# Patient Record
Sex: Female | Born: 1970 | Hispanic: No | Marital: Married | Smoking: Never smoker
Health system: Southern US, Community
[De-identification: ages and names within clinical notes are randomized; demographics above are authoritative.]

## PROBLEM LIST (undated history)

## (undated) DIAGNOSIS — I10 Essential (primary) hypertension: Secondary | ICD-10-CM

## (undated) DIAGNOSIS — K5790 Diverticulosis of intestine, part unspecified, without perforation or abscess without bleeding: Secondary | ICD-10-CM

## (undated) DIAGNOSIS — K219 Gastro-esophageal reflux disease without esophagitis: Secondary | ICD-10-CM

## (undated) DIAGNOSIS — J4540 Moderate persistent asthma, uncomplicated: Secondary | ICD-10-CM

## (undated) HISTORY — DX: Diverticulosis of intestine, part unspecified, without perforation or abscess without bleeding: K57.90

## (undated) HISTORY — DX: Gastro-esophageal reflux disease without esophagitis: K21.9

## (undated) HISTORY — PX: APPENDECTOMY: SHX54

## (undated) HISTORY — PX: HERNIA REPAIR: SHX51

---
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
## 2001-11-07 ENCOUNTER — Other Ambulatory Visit: Admission: RE | Admit: 2001-11-07 | Discharge: 2001-11-07 | Payer: Self-pay | Admitting: Gynecology

## 2002-11-29 ENCOUNTER — Other Ambulatory Visit: Admission: RE | Admit: 2002-11-29 | Discharge: 2002-11-29 | Payer: Self-pay | Admitting: Gynecology

## 2003-05-15 ENCOUNTER — Other Ambulatory Visit: Admission: RE | Admit: 2003-05-15 | Discharge: 2003-05-15 | Payer: Self-pay | Admitting: Gynecology

## 2003-10-10 ENCOUNTER — Other Ambulatory Visit: Admission: RE | Admit: 2003-10-10 | Discharge: 2003-10-10 | Payer: Self-pay | Admitting: Obstetrics and Gynecology

## 2004-04-29 ENCOUNTER — Inpatient Hospital Stay (HOSPITAL_COMMUNITY): Admission: AD | Admit: 2004-04-29 | Discharge: 2004-04-29 | Payer: Self-pay | Admitting: Obstetrics and Gynecology

## 2004-04-30 ENCOUNTER — Inpatient Hospital Stay (HOSPITAL_COMMUNITY): Admission: AD | Admit: 2004-04-30 | Discharge: 2004-05-03 | Payer: Self-pay | Admitting: Obstetrics and Gynecology

## 2004-06-09 ENCOUNTER — Other Ambulatory Visit: Admission: RE | Admit: 2004-06-09 | Discharge: 2004-06-09 | Payer: Self-pay | Admitting: Obstetrics and Gynecology

## 2004-12-22 ENCOUNTER — Other Ambulatory Visit: Admission: RE | Admit: 2004-12-22 | Discharge: 2004-12-22 | Payer: Self-pay | Admitting: Gynecology

## 2005-10-06 ENCOUNTER — Other Ambulatory Visit: Admission: RE | Admit: 2005-10-06 | Discharge: 2005-10-06 | Payer: Self-pay | Admitting: Gynecology

## 2006-03-29 ENCOUNTER — Other Ambulatory Visit: Admission: RE | Admit: 2006-03-29 | Discharge: 2006-03-29 | Payer: Self-pay | Admitting: Gynecology

## 2006-10-13 ENCOUNTER — Other Ambulatory Visit: Admission: RE | Admit: 2006-10-13 | Discharge: 2006-10-13 | Payer: Self-pay | Admitting: Gynecology

## 2007-03-28 ENCOUNTER — Other Ambulatory Visit: Admission: RE | Admit: 2007-03-28 | Discharge: 2007-03-28 | Payer: Self-pay | Admitting: Gynecology

## 2007-10-03 ENCOUNTER — Other Ambulatory Visit: Admission: RE | Admit: 2007-10-03 | Discharge: 2007-10-03 | Payer: Self-pay | Admitting: Gynecology

## 2008-05-15 ENCOUNTER — Other Ambulatory Visit: Admission: RE | Admit: 2008-05-15 | Discharge: 2008-05-15 | Payer: Self-pay | Admitting: Gynecology

## 2008-08-13 ENCOUNTER — Ambulatory Visit (HOSPITAL_COMMUNITY): Admission: RE | Admit: 2008-08-13 | Discharge: 2008-08-13 | Payer: Self-pay | Admitting: Family Medicine

## 2008-08-14 ENCOUNTER — Encounter (INDEPENDENT_AMBULATORY_CARE_PROVIDER_SITE_OTHER): Payer: Self-pay | Admitting: General Surgery

## 2008-08-14 ENCOUNTER — Ambulatory Visit (HOSPITAL_COMMUNITY): Admission: EM | Admit: 2008-08-14 | Discharge: 2008-08-14 | Payer: Self-pay | Admitting: Emergency Medicine

## 2009-10-07 ENCOUNTER — Ambulatory Visit: Payer: Self-pay | Admitting: Family Medicine

## 2010-02-12 IMAGING — CT CT PELVIS W/ CM
2 of 5 series · 16 of 46 positions shown, 18 images · IV contrast (omnipaque)
Comparison: None available.

CT ABDOMEN

CLINICAL DATA: Right lower quadrant pain.

CT ABDOMEN AND PELVIS WITH CONTRAST
TECHNIQUE: Multidetector CT imaging of the abdomen and pelvis was
performed using the standard protocol following bolus
administration of intravenous contrast.
Contrast: 100 ml Omnipaque 300.

[Series 2: abd_pel 5.0 b40s · axial · 0.72mm/px · z∈[-369,+21]mm · 13 of 88 slices shown, 15 images]
[im 5/88  soft-tissue]
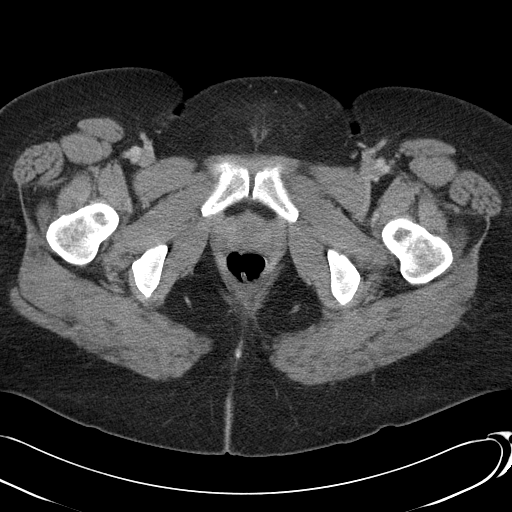
[im 5/88  bone]
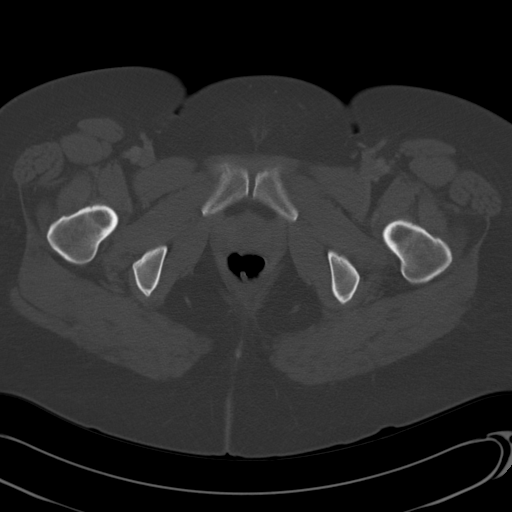
[im 14/88  soft-tissue]
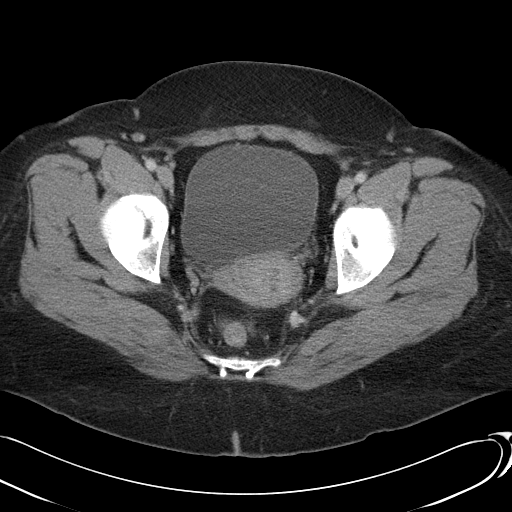
[im 19/88  soft-tissue]
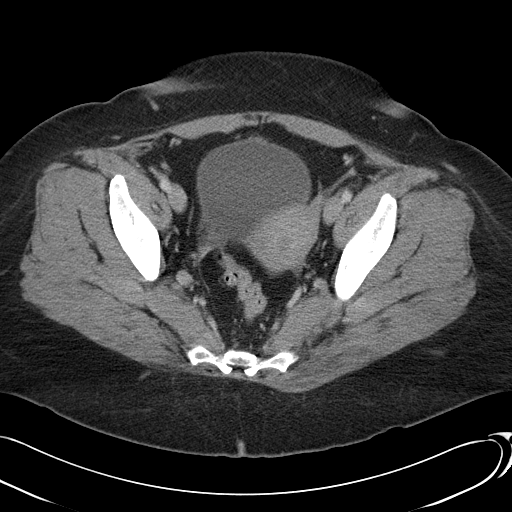
[im 23/88  soft-tissue]
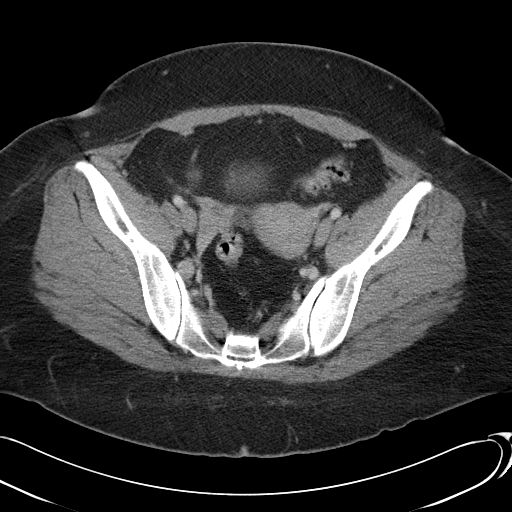
[im 33/88  soft-tissue]
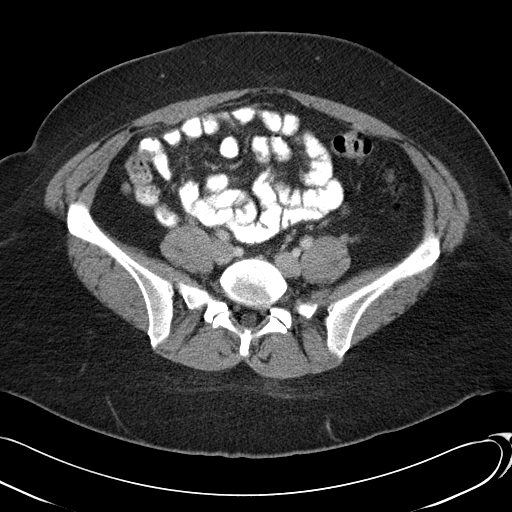
[im 37/88  soft-tissue]
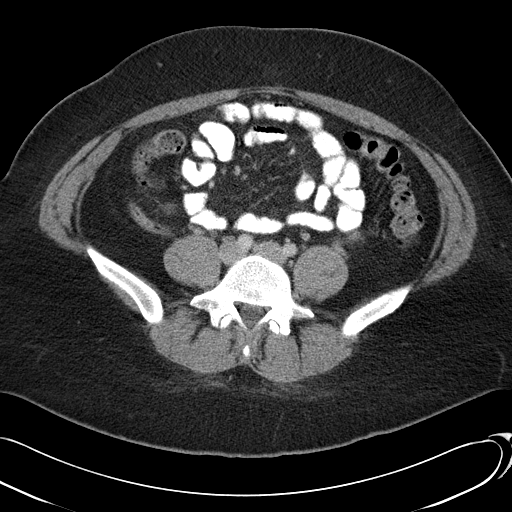
[im 46/88  soft-tissue]
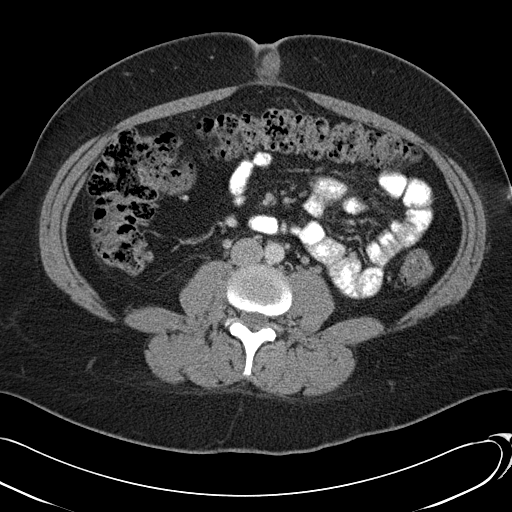
[im 51/88  soft-tissue]
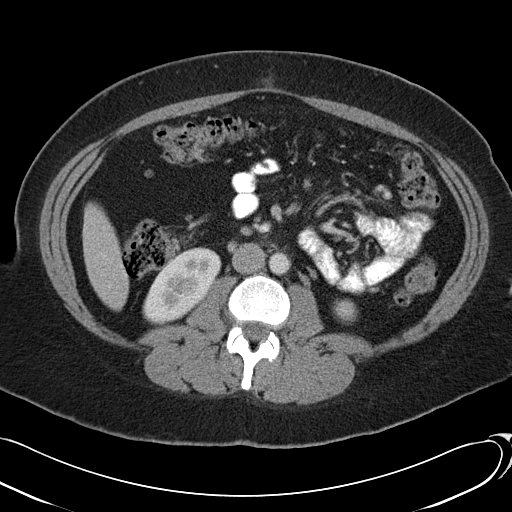
[im 55/88  soft-tissue]
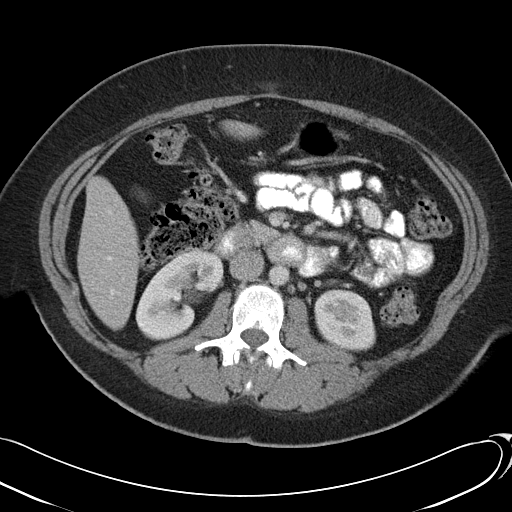
[im 55/88  bone]
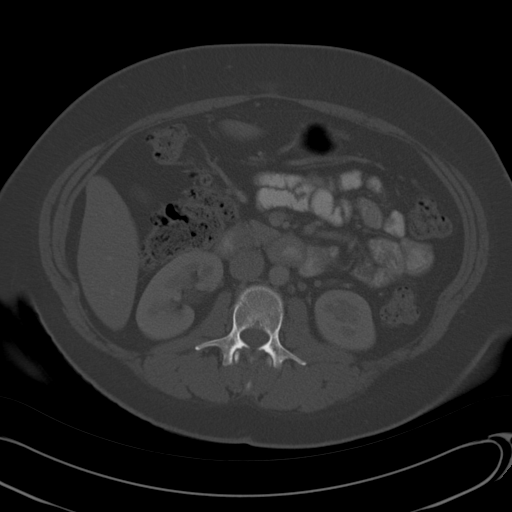
[im 65/88  soft-tissue]
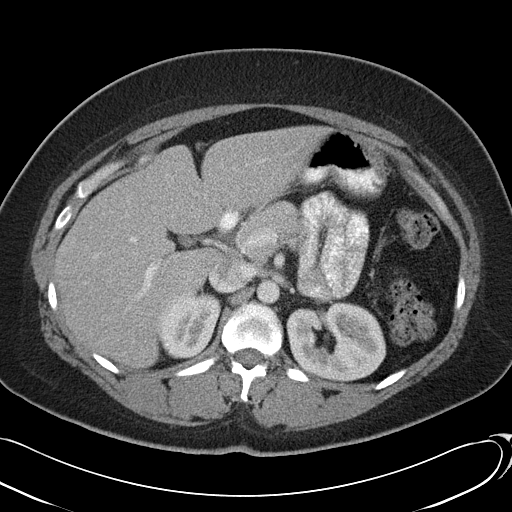
[im 69/88  soft-tissue]
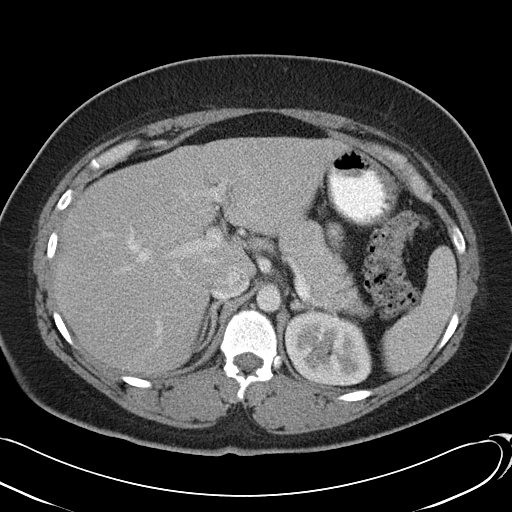
[im 74/88  soft-tissue]
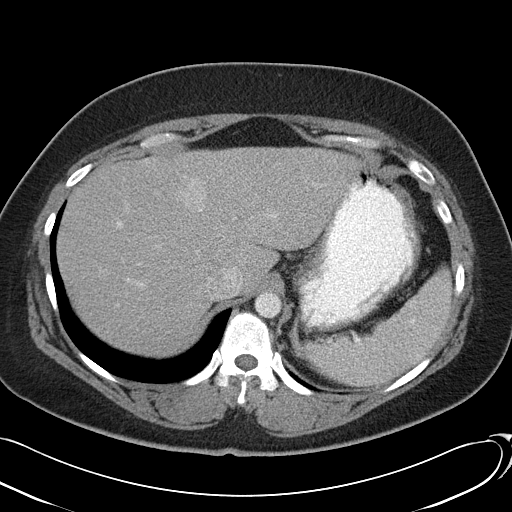
[im 83/88  soft-tissue]
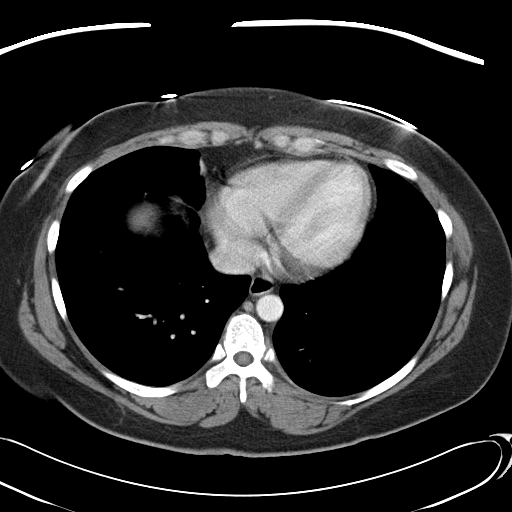

[Series 602: <mpr thick range> · coronal · 0.86mm/px · 3 of 88 slices shown]
[im 30/88  soft-tissue]
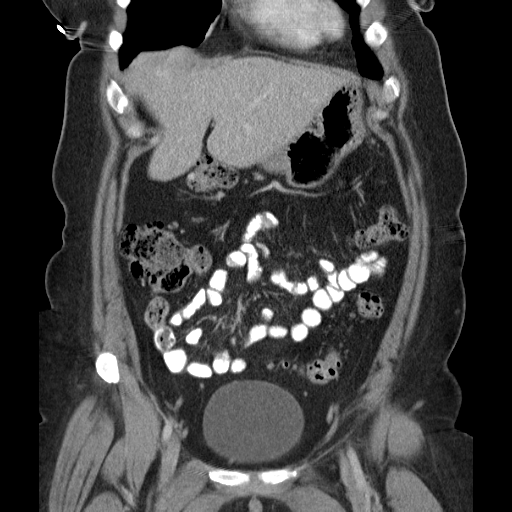
[im 39/88  soft-tissue]
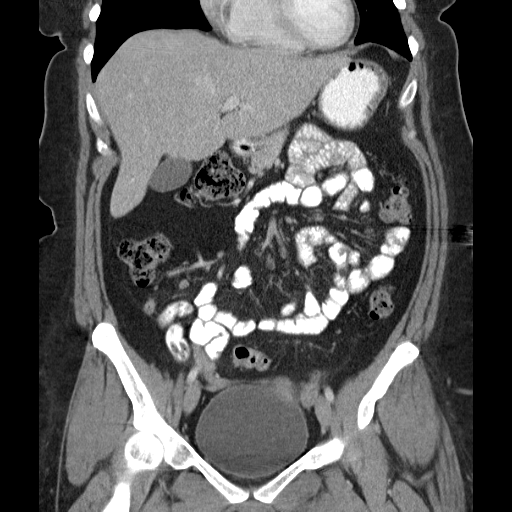
[im 49/88  soft-tissue]
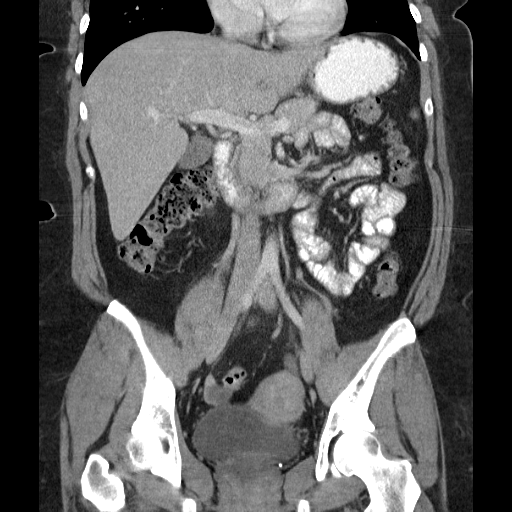

[16 of 46 positions shown; findings below may reference images not displayed]

FINDINGS: The lung bases are clear without focal nodule, mass, or
airspace disease.  Heart size is normal.  There is no significant
pleural or pericardial effusion.  The liver, the liver and spleen
are normal.  The stomach, pancreas, common bile duct and
gallbladder are normal.  The adrenal glands and kidneys are within
normal limits bilaterally.  There is no significant abdominal
lymphadenopathy or free fluid.  A periumbilical nodule measures
x 1.2 x 1.4 cm.  There is a small periumbilical hernia.  This
nodular lesion appears to be associated with stalk extending from
the fat in the hernia but external to the fascia.  It is within the
subcutaneous fat.  The bowel is unremarkable.  Bone windows are
within normal limits.
IMPRESSION: 1.  2.2 cm nodular density adjacent to a periumbilical hernia.
This may represent a herniated epiploic appendagitis.  Focal
infection is also considered.  Alternately, this could represent a
small lymph node.  If the patient undergoes surgery for her
appendix, surgical excision may be of use to evaluate this lesion
as well.

CT PELVIS
FINDINGS: Mild diverticular changes are present within the sigmoid
colon without focal inflammation to suggest diverticulitis.  The
appendix is mildly enlarged measuring 8 mm with some inflammatory
changes associated suggesting early appendicitis.  There is no free
air or free fluid.  The uterus and adnexa are within normal limits
for age.  The urinary bladder is unremarkable.  Bone windows are
normal.
IMPRESSION: 1.  Mild enlargement and straining of the appendix compatible with
early appendicitis.
2.  Otherwise unremarkable CT of the pelvis.

Critical test results telephoned to Dr. Papps at the time of

## 2010-11-23 LAB — BASIC METABOLIC PANEL
BUN: 9 mg/dL (ref 6–23)
Calcium: 9 mg/dL (ref 8.4–10.5)
Creatinine, Ser: 0.57 mg/dL (ref 0.4–1.2)
GFR calc non Af Amer: >60 mL/min (ref >60)
Potassium: 3.7 mEq/L (ref 3.5–5.1)

## 2010-11-23 LAB — DIFFERENTIAL
Basophils Absolute: 0.1 10*3/uL (ref 0.0–0.1)
Lymphocytes Relative: 27 % (ref 12–46)
Neutro Abs: 8.7 10*3/uL — ABNORMAL HIGH (ref 1.7–7.7)
Neutrophils Relative %: 64 % (ref 43–77)

## 2010-11-23 LAB — POCT PREGNANCY, URINE: Preg Test, Ur: NEGATIVE

## 2010-11-23 LAB — CBC
Hemoglobin: 14.6 g/dL (ref 12.0–15.0)
Platelets: 175 10*3/uL (ref 150–400)
RDW: 11.8 % (ref 11.5–15.5)

## 2010-12-12 ENCOUNTER — Encounter: Payer: Self-pay | Admitting: Family Medicine

## 2010-12-22 NOTE — Op Note (Signed)
NAME:  Stacy Tran, Stacy Tran                ACCOUNT NO.:  [id number]   MEDICAL RECORD NO.:  [id number]          PATIENT TYPE:  INP   LOCATION:  1524                         FACILITY:  [facility]   PHYSICIAN:  Adolph Pollack, M.D.DATE OF BIRTH:  1970-08-31   DATE OF PROCEDURE:  08/14/2008  DATE OF DISCHARGE:                               OPERATIVE REPORT   PREOPERATIVE DIAGNOSES:  Early acute appendicitis, possible umbilical  hernia.   POSTOPERATIVE DIAGNOSES:  1. Early acute appendicitis.  2. Omental nodule.  3. Epigastric hernia.   PROCEDURE:  1. Laparoscopic appendectomy.  2. Excision of omental nodule.  3. Primary epigastric hernia repair.   SURGEON:  Adolph Pollack, M.D.   ANESTHESIA:  General.   INDICATIONS:  This 40 year old female had the onset of some right lower  quadrant pain she states on January 2 that persisted.  She was feeling  around her abdomen and felt a tender nodule superior to her umbilicus.  She presented to Urgent Care and was sent to [facility] for a CT scan  which demonstrated findings consistent with early acute appendicitis as  well as a possible umbilical hernia containing fat.  She is now brought  to the operating room because of the early acute appendicitis.  At  examination she does have a tender nodule superior to the umbilicus.   TECHNIQUE:  She was brought to the operating room and placed supine on  the operating table.  General anesthetic was administered.  Foley  catheter placed in the bladder.  The abdominal wall was sterilely  prepped and draped.  Marcaine solution was infiltrated in the  supraumbilical region.  A transverse supraumbilical incision was made  through skin and subcutaneous tissue.  I noted a nodular density  superior to the umbilicus.  Using careful blunt dissection, I noted this  nodular density was attached the omentum which was coming through a  small epigastric defect consistent with a small epigastric hernia.  I  went  ahead and excised this nodule and sent it to pathology and  permanent section.  I then reduced the omentum back through the hernia  defect.  A pursestring suture was placed around the fascial defect and  the [facility] trocar was introduced and pneumoperitoneum created.   Laparoscope was then introduced and a 5 mm trocar was placed in the left  lower quadrant, one in the right upper quadrant.  The appendix was  identified and the mesoappendix divided down to the base of cecum.  The  appendix appeared mildly injected distally.  I then amputated the  appendix off the cecum along with a small cuff of cecum using the Endo-  GIA stapler.  The appendix was placed in an Endopouch bag and removed  through the Hasson trocar port incision.   The Hasson trocar was replaced and the staple line was inspected and  there was some bleeding from the medial aspect the staple line which was  controlled with hemoclips.  I then copiously irrigated out the abdominal  cavity and I examined both ovaries both of which appeared to have some  small  cysts on them.  There is one questionable area noted on the right  ovary but I could not see an obvious solid mass.   Following this, irrigation fluid was evacuated as much as possible.  I  subsequently removed all trocars and released pneumoperitoneum.  I then  closed the umbilical hernia defect in the epigastric region superior to  the umbilicus with interrupted #1 Novofil sutures.  The pursestring  suture was also tied down.  The skin incisions were closed with 4-0  Monocryl subcuticular stitches followed by Steri-Strips and sterile  dressings.   She tolerated the procedure without apparent complications and was taken  to recovery in satisfactory condition.      Adolph Pollack, M.D.  Electronically Signed     TJR/MEDQ  D:  08/14/2008  T:  08/14/2008  Job:  [id number]   cc:   Clydie Braun L. Hal Hope, M.D.  Fax: [phone]

## 2010-12-22 NOTE — H&P (Signed)
NAME:  Coad, LING                ACCOUNT NO.:  [id number]   MEDICAL RECORD NO.:  [id number]          PATIENT TYPE:  INP   LOCATION:  1524                         FACILITY:  [facility]   PHYSICIAN:  Adolph Pollack, M.D.DATE OF BIRTH:  03-Dec-1970   DATE OF ADMISSION:  08/14/2008  DATE OF DISCHARGE:                              HISTORY & PHYSICAL   CHIEF COMPLAINT:  Right lower quadrant pain and umbilical pain.   HISTORY:  This 40 year old female had the onset of some right lower  quadrant pain along with a painful umbilical nodule.  This occurred  August 10, 2008, and persisted.  She went to urgent care the evening of  August 13, 2008, and was seen, evaluated and sent for CT scan at [facility] which was consistent with early acute appendicitis and had a  questionable small umbilical hernia with fatty tissue in it.  She  subsequently sent to the emergency department and I was asked to see  her.  No fever, chills.  No nausea, vomiting, diarrhea, dysuria,  hematuria.   PAST MEDICAL HISTORY:  No chronic illnesses.  Operations, cesarean  section complicated by a wound healing problem.   DRUG ALLERGIES:  TETRACYCLINE.   CURRENT MEDICATIONS:  None.   SOCIAL HISTORY:  She is married with one child.  She works in Airline [employer].  No  tobacco or alcohol use.  Here with her husband.   FAMILY HISTORY:  Notable for hypertension in her father.  Cancer in her  mother, type unknown but her mother apparently died from it.   REVIEW OF SYSTEMS:  CARDIOVASCULAR:  She has been told she has a heart  murmur but no coronary artery disease or hypertension.  PULMONARY:  No  asthma, pneumonia, tuberculosis.  GI:  Has some intermittent  gastroesophageal reflux.  No peptic ulcer disease.  No hepatitis or  diverticulitis although on CT scan there were changes of diverticulosis  and this has been explained to her.  GU:  No kidney stones.  ENDOCRINE:  No diabetes, thyroid disease or hypercholesterolemia.   NEUROLOGIC:  No  seizures.  HEMATOLOGIC:  No bleeding disorder, blood clots or  transfusions.   PHYSICAL EXAMINATION:  GENERAL:  Generally an overweight female in no  acute distress, very pleasant and cooperative.  VITAL SIGNS:  Temperature is 98 degrees, blood pressure is 129/85, pulse  86 and respiratory rate 20.  HEENT:  Extraocular motions intact.  No icterus.  NECK:  Supple without masses.  RESPIRATORY:  Breath sounds equal and clear, respirations unlabored.  CARDIOVASCULAR:  Heart demonstrates a regular rate and regular rhythm.  No murmur.  No JVD.  No lower extremity edema.  ABDOMEN:  Soft with tender supraumbilical nodule.  There is right lower  quadrant tenderness to palpation as well.  No guarding or mass.  EXTREMITIES:  Good muscle tone and range of motion.  SKIN:  No jaundice.   LABORATORY DATA:  Is pending at this time - it was done at the urgent  care but we do not have a copy of this.  Per the patient, she also had a  chest x-ray and abdominal x-ray and were told these were all within  normal limits.   IMPRESSION:  Early acute appendicitis with a possible umbilical hernia.   PLAN:  Laparoscopic/open appendectomy.  If possible we may be able to do  a primary umbilical hernia if indeed it exists.  I discussed the  procedure and the risks with her.  Risks including but not limited to  bleeding, infection, wound healing problems, anesthesia, accidental  injury to intra-abdominal organs.  We also talked about the importance  of aftercare and light activities.  She seems to understand all this and  agrees with the plan.      Adolph Pollack, M.D.  Electronically Signed     TJR/MEDQ  D:  08/14/2008  T:  08/14/2008  Job:  [id number]   cc:   Clydie Braun L. Hal Hope, M.D.  Fax: [phone]

## 2010-12-25 NOTE — Op Note (Signed)
NAME:  Stacy Tran, Stacy Tran                ACCOUNT NO.:  [id number]   MEDICAL RECORD NO.:  [id number]          PATIENT TYPE:  INP   LOCATION:  9111                          FACILITY:  [facility]   PHYSICIAN:  Michelle L. Grewal, M.D.DATE OF BIRTH:  [birth year]   DATE OF PROCEDURE:  04/30/2004  DATE OF DISCHARGE:                                 OPERATIVE REPORT   PREOPERATIVE DIAGNOSES:  1.  Intrauterine pregnancy at term.  2.  Arrest of descent.   POSTOPERATIVE DIAGNOSES:  1.  Intrauterine pregnancy at term.  2.  Arrest of descent.   PROCEDURE:  Primary low transverse cesarean section.   SURGEON:  Michelle L. Vincente Poli, M.D.   ANESTHESIA:  Epidural.   ESTIMATED BLOOD LOSS:  900 mL.   URINE OUTPUT:  Clear.   IV FLUIDS:  800 mL.   FINDINGS:  Female infant in cephalic presentation.  Apgar's 9 at 1 minute and  9 at 5 minutes with a weight of 7 pounds 14 ounces.   DESCRIPTION OF PROCEDURE:  The patient was taken to the operating room, her  epidural found to be adequate.  She was then prepped and draped in the usual  sterile fashion. A Foley catheter had already been placed in labor and  delivery. A low transverse incision was made, carried down to the fascia and  the fascia scored in the midline and extended laterally. Rectus muscles were  separated in the midline, the perineum was entered bluntly and the  peritoneum was then entered sharply. The bladder blade was inserted, the  lower uterine segment was identified and the bladder flap was created  sharply and then digitally. The bladder blade was then readjusted. A low  transverse incision was made in the uterus, the amniotic fluid was noted to  be clear. The baby was in cephalic presentation and was delivered quite  easily. It was a female infant with Apgar's 9 at 1 minute and 9 at 5 minutes  and was 7 pounds and 14 ounces. The baby was then handed to the waiting  pediatrician after the cord was clamped and cut. The placenta was manually  removed and noted to be normal and intact. The uterus was cleared of all  clots and debris. The uterine incision was closed using #0 chromic in a  continuous running locked stitch. There was some oozing on the left corner  which was hemostatic after a figure-of-eight of #0 chromic was placed.  Irrigation was performed, hemostasis was again noted, the peritoneum was  closed using #0 Vicryl continuous running stitch and rectus muscles were  reapproximated using the same #0 Vicryl.  The fascia was closed using #0  Vicryl in continuous running stitch  starting in each corner and meeting in the midline. After irrigation in the  subcutaneous layer, the skin was closed with staples. All sponge, lap and  instruments are correct x2.  The patient tolerated the procedure well and  went to the recovery room in stable condition.      MLG/MEDQ  D:  04/30/2004  T:  05/01/2004  Job:  [id number]

## 2010-12-25 NOTE — Discharge Summary (Signed)
NAME:  Stacy Tran, Stacy Tran                ACCOUNT NO.:  [id number]   MEDICAL RECORD NO.:  [id number]          PATIENT TYPE:  INP   LOCATION:  9111                          FACILITY:  [facility]   PHYSICIAN:  Guy Sandifer. Henderson Cloud, M.D. DATE OF BIRTH:  [birth year]   DATE OF ADMISSION:  04/30/2004  DATE OF DISCHARGE:  05/03/2004                                 DISCHARGE SUMMARY   ADMITTING DIAGNOSES:  1.  Intrauterine pregnancy at term.  2.  Active labor.   DISCHARGE DIAGNOSES:  1.  Status post low transverse cesarean section secondary to arrest of      descent.  2.  Viable female infant.   PROCEDURE:  Primary low transverse cesarean section.   REASON FOR ADMISSION:  Please see written H&P.   HOSPITAL COURSE:  The patient was a 40 year old prima gravida who presented  to [facility] in active labor.  On admission, vital signs were stable.  Fetal heart tones were reactive.  Contractions were noted to be every two to  four minutes.  The cervix was dilated to 8 cm, 90% effaced, vertex at a 0  station and clear fluid was noted to be leaking from the forebag.  The  patient was noted to be positive for group B beta strep and ampicillin was  given per protocol for prophylaxis.  After several hours of labor, the  patient did progress to complete dilatation and after pushing for  approximately 1-1/2 hours vertex remained at a +1 station without further  descent.  Decision was made to proceed with a low transverse cesarean  section.  The patient was then transferred to the operating room where  epidural was dosed to an adequate surgical level.  A low transverse incision  was made with the delivery of a viable female infant weighing 7 pounds 14  ounces with Apgars of 9 at one minute and 9 at five minutes.  The patient  tolerated the procedure well and was taken to the recovery room in stable  condition.  On postoperative day #1, vital signs were stable.  Abdomen was  soft.  Fundus was firm.  Abdominal  dressing was noted to be clean, dry and  intact.  Labs revealed a hemoglobin of 10.7.  On postoperative day #2, vital  signs remained stable.  Abdomen was soft with good return of bowel function.  Fundus was firm and nontender.  Abdominal dressing has been removed.  The  incision was clean, dry and intact.  The patient was able to eat well and  tolerating a regular diet without complaints of nausea or vomiting.  On  postoperative day #3, the patient was doing well.  The abdomen was soft.  The fundus was firm and nontender.  Incision was clean, dry and intact.  Staples were removed, and the patient was discharged home.   DISCHARGE CONDITION:  Good.   DIET:  Regular as tolerated.   ACTIVITY:  No heavy lifting, no driving x2 weeks.  No vaginal entry.   FOLLOW UP:  The patient is to follow up in the office in one to two weeks  for incision check.  She is to call for temperature greater than 100  degrees, persistent nausea and vomiting, heavy vaginal bleeding and/or  redness and drainage from the incisional site.   DISCHARGE MEDICATIONS:  1.  Percocet #30 one p.o. every 4-6 hours p.r.n.  2.  Prenatal vitamins one p.o. daily.      CC/MEDQ  D:  05/17/2004  T:  05/17/2004  Job:  [id number]

## 2011-12-27 ENCOUNTER — Ambulatory Visit: Payer: 59 | Admitting: Family Medicine

## 2011-12-27 VITALS — BP 107/73 | HR 72 | Temp 98.2°F | Resp 16 | Ht 63.18 in | Wt 234.6 lb

## 2011-12-27 DIAGNOSIS — R1110 Vomiting, unspecified: Secondary | ICD-10-CM

## 2011-12-27 NOTE — Progress Notes (Signed)
  Subjective:    Patient ID: Stacy Tran, female    DOB: July 09, 1971, 41 y.o.   MRN: [id number]  HPI 41 yo female here with GI complaints.  Started Saturday with nauea, indigestion.  Used pepto all day.  Helped some.  Started vomitting up phlegm Saturday night and associated diarrhea.  Diarrhea has continued off and on Sunday and today.  WHen tried to eat last night vomitted after.  Did keep a bagel down today.  Hasn't been drinking much - afraid of vomitting and sleeping a lot.  No fever.  Stomach gurgly and stomach crampy.  Just started probiotics.  SEems to be slowing down some.   Worried it is from strawberries she ate.    Review of Systems Negative except as per HPI     Objective:   Physical Exam  Constitutional: Vital signs are normal. She appears well-developed and well-nourished. She is active.  Cardiovascular: Normal rate, regular rhythm, normal heart sounds and normal pulses.   Pulmonary/Chest: Effort normal and breath sounds normal.  Abdominal: Soft. Normal appearance and bowel sounds are normal. She exhibits no distension and no mass. There is no hepatosplenomegaly. There is no tenderness. There is no rigidity, no rebound, no guarding, no CVA tenderness, no tenderness at McBurney's point and negative Murphy's sign. No hernia.  Neurological: She is alert.          Assessment & Plan:  Vomitting and diarrhea.  Likely viral GE.  Will bring back sample for stool culture.  Declines zofran and phenergan.  Symptoms improved after 2 liters IVF.

## 2012-02-16 ENCOUNTER — Ambulatory Visit (INDEPENDENT_AMBULATORY_CARE_PROVIDER_SITE_OTHER): Payer: 59 | Admitting: Physician Assistant

## 2012-02-16 VITALS — BP 130/88 | HR 86 | Temp 98.3°F | Resp 18 | Ht 63.0 in | Wt 242.2 lb

## 2012-02-16 DIAGNOSIS — L255 Unspecified contact dermatitis due to plants, except food: Secondary | ICD-10-CM

## 2012-02-16 MED ORDER — METHYLPREDNISOLONE ACETATE 80 MG/ML IJ SUSP
80.0000 mg | Freq: Once | INTRAMUSCULAR | Status: AC
  Administered 2012-02-16: 80 mg via INTRAMUSCULAR

## 2012-02-16 MED ORDER — PREDNISONE 10 MG PO TABS: ORAL_TABLET | ORAL | Status: DC

## 2012-02-16 NOTE — Progress Notes (Signed)
  Subjective:    Patient ID: Stacy Tran, female    DOB: 1970-09-09, 41 y.o.   MRN: [id number]  HPI Stacy Tran comes in today with a rash x 3 days that is spreading and is very itchy.  She thinks she was in poison ivy.  It is on her face, arms, trunk and legs. She is not a diabetic and does not have HTN  Review of Systems As noted in HPI, otherwise negative     Objective:   Physical Exam  Constitutional: Vital signs are normal. She appears well-developed and well-nourished.  Skin:       Right wrist with linear veisicular lesions and several bullae.  Smaller vesicular lesions on forehead and postauricular areas.          Assessment & Plan:  Contact Dermatitis  Depo Medrol 80 mg IM now Prednisone 10 mg 6 day taper Claritin or Benadryl

## 2012-07-25 ENCOUNTER — Ambulatory Visit: Payer: 59 | Admitting: Family Medicine

## 2012-07-25 VITALS — BP 125/83 | HR 70 | Temp 98.9°F | Resp 18 | Ht 63.0 in | Wt 245.4 lb

## 2012-07-25 DIAGNOSIS — R002 Palpitations: Secondary | ICD-10-CM

## 2012-07-25 DIAGNOSIS — I493 Ventricular premature depolarization: Secondary | ICD-10-CM

## 2012-07-25 DIAGNOSIS — I4949 Other premature depolarization: Secondary | ICD-10-CM

## 2012-07-25 LAB — BASIC METABOLIC PANEL
Chloride: 104 mEq/L (ref 96–112)
Potassium: 4 mEq/L (ref 3.5–5.3)

## 2012-07-25 LAB — TSH: TSH: 1.691 u[IU]/mL (ref 0.350–4.500)

## 2012-07-25 NOTE — Patient Instructions (Addendum)
We will make the referral to the cardiologist for you.

## 2012-07-25 NOTE — Progress Notes (Signed)
Subjective: Patient is here with a history of having had palpitations last several nights which lies down at night. Initially it was just at nighttime, but yesterday and today she fell in the daytime. She was told by her gynecologist a couple of years ago that she might have mitral valve prolapse. Otherwise she is healthy with no major concerns. She has had some gallbladder issues apparently she's been "treating naturally".  Objective: Chest clear. Heart regular without murmurs gallops or arrhythmias.  Did an EKG on her and left from the monitor there are watched it for several minutes. So one single PVC during that time. EKG looks normal.  Assessment: BMeT and TSH  If everything is normal consider seeing a cardiologist.

## 2012-07-28 ENCOUNTER — Encounter: Payer: Self-pay | Admitting: Cardiology

## 2012-07-28 NOTE — Progress Notes (Signed)
Patient ID: Stacy Tran, female   DOB: [birth year], 41 y.o.   MRN: [id number]  Stacy Tran    Date of visit:  07/28/2012 DOB:  [birth year]    Age:  41 yrs. Medical record number:  [id number]     Account number:  [id number] Primary Care Provider: HOPPER,DAVID ____________________________ CURRENT DIAGNOSES  1. Obesity, morbid (BMI>40)  2. GERD  3. Palpitations  4. Murmur ____________________________ ALLERGIES  doxycycline hyclate  tetracycline ____________________________ MEDICATIONS  1. No Meds ____________________________ CHIEF COMPLAINTS  Palpitations ____________________________ HISTORY OF PRESENT ILLNESS  Patient seen for evaluation of palpitations at the request of Dr. Alwyn Ren. The patient has previously been in good health except for significant obesity as well as reflux. She recently presented to Dr. Frederik Pear office with a complaint of episodic palpitations described as her heart fluttering. She has never had syncope or dizziness with the episodes. They have not been prolonged. She does not have any history of drug use and has not been under significant stress. She is not using much caffeine and has not been using over-the-counter decongestants. She denies angina and has no PND, orthopnea, or syncope. She may have been told that she had mitral valve prolapse previously. ____________________________ PAST HISTORY  Past Medical Illnesses:  GERD, diverticulosis, morbid obesity;  Cardiovascular Illnesses:  no previous history of cardiac disease.;  Surgical Procedures:  appendectomy, hernia repair, cesarean section;  Cardiology Procedures-Invasive:  no history of prior cardiac procedures;  Cardiology Procedures-Noninvasive:  event monitor December 2013;  LVEF not documented ____________________________ CARDIO-PULMONARY TEST DATES EKG Date:  07/28/2012;  Holter/Event Monitor Date: 07/28/2012;   ____________________________ FAMILY HISTORY Father - age 57,  alive and well and  hypertension;  Mother - age 1,  deceased and cancer-unknown type; Brother 1 - age 82,  alive and well;  ____________________________ SOCIAL HISTORY Alcohol Use:  1 q week;  Smoking:  never smoked;  Diet:  regular diet;  Lifestyle:  married;  Exercise:  some exercise;  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  Eyes:  denies diplopia, history of glaucoma or visual problems.  Ears, Nose, Throat, Mouth:  denies any hearing loss, epistaxis, hoarseness or difficulty speaking.   Respiratory:  denies dyspnea, cough, wheezing or hemoptysis. Cardiovascular:  please review HPI   Abdominal:  denies dyspepsia, GI bleeding, constipation, or diarrhea  Genitourinary-Female:  no dysuria, urgency, frequency, UTIs, or stress incontinence   Musculoskeletal:  denies arthritis, venous insufficiency, or muscle weakness.  Neurological:  occasional headaches  Psychiatric:  denies depession or anxiety. ____________________________ PHYSICAL EXAMINATION VITAL SIGNS  Blood Pressure:  124/70 Sitting, Left arm, large cuff  , 118/70 Supine, Left arm and large cuff   Pulse:  76/min. Weight:  244.00 lbs. Height:  63"BMI: 43  Constitutional:  pleasant white female, in no acute distress, severely obese Skin:  warm and dry to touch, no apparent skin lesions, or masses noted. Head:  normocephalic, normal hair pattern, no masses or tenderness Eyes:  EOMS Intact, PERRLA, C and S clear, Funduscopic exam not done. ENT:  ears, nose and throat reveal no gross abnormalities.  Dentition good. Neck:  supple, without massess. No JVD, thyromegaly or carotid bruits. Carotid upstroke normal. Chest:  normal symmetry, clear to auscultation and percussion. Cardiac:  regular rhythm, normal S1 and S2, No S3 or S4, 1/6 sytolic murmur at left sternal border Abdomen:  abdomen soft,non-tender, no  masses, no  hepatospenomegaly, or aneurysm noted Peripheral Pulses:  the femoral,dorsalis pedis, and posterior tibial pulses are full and equal bilaterally with no bruits auscultated. Extremities & Back:  no deformities, clubbing, cyanosis, erythema or edema observed. Normal muscle strength and tone. Neurological:  no gross motor or sensory deficits noted, affect appropriate, oriented x3. ____________________________ IMPRESSIONS/PLAN  1. Episodic palpitations 2. Very faint systolic heart murmur 3. Morbid obesity 4. GERD  Recommendations:  Place event monitor to assess for etiology and diagnosis of what the palpitations are due to. Because of the systolic heart murmur obtain echocardiogram to assess for structural heart disease and the presence of palpitations. Follow up afterwards. ____________________________ TODAYS ORDERS  1. 12 Lead EKG: Today  2. 2D, color flow, doppler: First Available  3. [facility]: Today                       ____________________________ Cardiology Physician:  Darden Palmer MD [facility]

## 2012-08-23 ENCOUNTER — Encounter: Payer: Self-pay | Admitting: Cardiology

## 2012-08-23 NOTE — Progress Notes (Signed)
Patient ID: Stacy Tran, female   DOB: 08/15/1970, 42 y.o.   MRN: [id number] Deshaun, Schou    Date of visit:  08/23/2012 DOB:  15-Jan-1971    Age:  41 yrs. Medical record number:  [id number]     Account number:  [id number] Primary Care Provider: HOPPER,DAVID ____________________________ CURRENT DIAGNOSES  1. Palpitations  2. Obesity, morbid (BMI>40)  3. GERD ____________________________ ALLERGIES  doxycycline hyclate  tetracycline ____________________________ MEDICATIONS  1. No Meds ____________________________ CHIEF COMPLAINTS  Followup of Palpitations ____________________________ HISTORY OF PRESENT ILLNESS  Patient seen back for cardiac evaluation. She has worn a cardiac event monitor for the past month and has not had any cardiac arrhythmias. She activated the monitor for complaints of palpitations that merely showed sinus rhythm. Her echocardiogram today is normal. There is no evidence of valvular problems and her left and right ventricular function were normal. ____________________________ PAST HISTORY  Past Medical Illnesses:  GERD, diverticulosis, morbid obesity;  Cardiovascular Illnesses:  no previous history of cardiac disease.;  Surgical Procedures:  appendectomy, hernia repair, cesarean section;  Cardiology Procedures-Invasive:  no history of prior cardiac procedures;  Cardiology Procedures-Noninvasive:  event monitor December 2013, echocardiogram January 2014;  LVEF of 60% documented via echocardiogram on 08/23/2012 ____________________________ CARDIO-PULMONARY TEST DATES EKG Date:  07/28/2012;  Holter/Event Monitor Date: 07/28/2012;  Echocardiography Date: 08/23/2012;   ____________________________ SOCIAL HISTORY Alcohol Use:  1 q week;  Smoking:  never smoked;  Diet:  regular diet;  Lifestyle:  married;  Exercise:  some exercise;  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 Weight:  242.00 lbs. Height:  63"BMI: 43  Constitutional:  pleasant white female, in no acute distress, severely obese Chest:  clear to auscultation and percussion Cardiac:  regular rhythm, normal S1 and S2, No S3 or S4, no murmurs, gallops or rubs detected. ____________________________ IMPRESSIONS/PLAN  1. Palpitations that do not have a cardiac association or etiology. 2. Morbid obesity with need to lose weight 3. Faint systolic heart murmur likely flow murmur  Recommendations:  Reassure her. Discussed importance long term of losing down to an ideal body weight. I will see her as needed. ____________________________ TODAYS ORDERS  1. Return: prn                       ____________________________ Cardiology Physician:  Darden Palmer MD [facility]

## 2012-10-25 ENCOUNTER — Encounter: Payer: Self-pay | Admitting: Family Medicine

## 2013-05-15 ENCOUNTER — Other Ambulatory Visit: Payer: Self-pay | Admitting: Gynecology

## 2013-07-11 ENCOUNTER — Ambulatory Visit: Payer: 59 | Admitting: Family Medicine

## 2013-07-11 ENCOUNTER — Encounter: Payer: Self-pay | Admitting: Family Medicine

## 2013-07-11 ENCOUNTER — Ambulatory Visit: Payer: 59

## 2013-07-11 VITALS — BP 122/80 | HR 74 | Temp 97.8°F | Resp 16 | Ht 64.0 in | Wt 253.0 lb

## 2013-07-11 DIAGNOSIS — S8001XA Contusion of right knee, initial encounter: Secondary | ICD-10-CM

## 2013-07-11 DIAGNOSIS — S8000XA Contusion of unspecified knee, initial encounter: Secondary | ICD-10-CM

## 2013-07-11 DIAGNOSIS — M79672 Pain in left foot: Secondary | ICD-10-CM

## 2013-07-11 DIAGNOSIS — M79609 Pain in unspecified limb: Secondary | ICD-10-CM

## 2013-07-11 MED ORDER — TRAMADOL HCL 50 MG PO TABS: 50.0000 mg | ORAL_TABLET | Freq: Three times a day (TID) | ORAL | Status: DC | PRN

## 2013-07-11 NOTE — Progress Notes (Signed)
[facility] [address], [place] [address] [phone]  Date:  07/11/2013   Name:  Stacy Tran   DOB:  [birth year]   MRN:  [id number]  PCP:  No primary provider on file.    Chief Complaint: Foot Injury and Knee Pain   History of Present Illness:  Stacy Tran is a 42 y.o. very pleasant female patient who presents with the following:  Yesterday she was at her son's school- she slipped and fell.  She went down hard on her right knee and her left foot went under her.  The knee swelled and hurt right away, and an hour later the foot started to hurt. She is able to get around with a crutch but she is moving slowly and with pain.   The pain kept her up last night but seems to be better now.  She is otherwise generally healthy  LMP about one week ago.    There are no active problems to display for this patient.   Past Medical History  Diagnosis Date  . GERD (gastroesophageal reflux disease)   . Diverticulosis     Past Surgical History  Procedure Laterality Date  . Cesarean section    . Appendectomy    . Hernia repair      History  Substance Use Topics  . Smoking status: Never Smoker   . Smokeless tobacco: Not on file  . Alcohol Use: Yes     Comment: once a week    Family History  Problem Relation Age of Onset  . Hypertension Father   . Diabetes Paternal Uncle   . Heart disease Paternal Aunt     Allergies  Allergen Reactions  . Doxycycline   . Tetracyclines & Related     Medication list has been reviewed and updated.  Current Outpatient Prescriptions on File Prior to Visit  Medication Sig Dispense Refill  . predniSONE (DELTASONE) 10 MG tablet 6,5,4,3,2,1 taper  21 tablet  0   No current facility-administered medications on file prior to visit.    Review of Systems:  As per HPI- otherwise negative.   Physical Examination: Filed Vitals:   07/11/13 0934  BP: 122/80  Pulse: 74  Temp: 97.8 F (36.6 C)  Resp: 16    Filed Vitals:   07/11/13 0934  Height: 5\' 4"  (1.626 m)  Weight: 253 lb (114.76 kg)   Body mass index is 43.41 kg/(m^2). Ideal Body Weight: Weight in (lb) to have BMI = 25: 145.3  GEN: WDWN, NAD, Non-toxic, A & O x 3, overweight, looks well HEENT: Atraumatic, Normocephalic. Neck supple. No masses, No LAD. Ears and Nose: No external deformity. CV: RRR, No M/G/R. No JVD. No thrill. No extra heart sounds. PULM: CTA B, no wheezes, crackles, rhonchi. No retractions. No resp. distress. No accessory muscle use. EXTR: No c/c/e NEURO slow gait, favoring right knee PSYCH: Normally interactive. Conversant. Not depressed or anxious appearing.  Calm demeanor.  Right knee:  Small abrasion over patella. She is tender over the abrasion. No swelling, heat, or bruise.  Normal flexion and extension of knee, knee feels stable.  Able to perform SLR.  Left foot: tender along the lateral foot.  Minimal swelling, no bruise.  Ankle is negative. Achilles intact  [facility] reading (PRIMARY) by  Dr. Patsy Lager. Right knee: negative Left foot: negative  LEFT FOOT - COMPLETE 3+ VIEW  COMPARISON: None.  FINDINGS: Three views of the left foot submitted. No acute fracture or subluxation.  Mild hallux valgus.  IMPRESSION: Negative.  RIGHT KNEE - COMPLETE 4+ VIEW  COMPARISON: None.  FINDINGS: Subtle medial compartmental marginal spurring. Mild proximal patellar articular spurring.  No knee effusion or fracture observed. No acute bony findings.  IMPRESSION: 1. Subtle medial compartmental and proximal patellar spurring. No acute findings. If pain persists despite conservative therapy, MRI may be warranted for further characterization.  Assessment and Plan: Contusion of right knee, initial encounter - Plan: DG Knee Complete 4 Views Right, traMADol (ULTRAM) 50 MG tablet  Pain in left foot - Plan: DG Foot Complete Left, traMADol (ULTRAM) 50 MG tablet  Contusion of right knee and sprain left foot.  Post- op  shoe, crutches.  Tramadol to use if needed.  If not better in a few days she will let me know   Signed Abbe Amsterdam, MD

## 2013-07-11 NOTE — Patient Instructions (Signed)
Use the crutches as needed for support.   Try ice 3 or 4 times a day on your knee and foot.  Use the tramadol if needed for pain- you can also try ibuprofen or tylenol as needed.    You can wean off of the crutches and boot as you are able.    Let me know if you are not feeling a good bit better in the next 4 or 5 days.

## 2014-03-12 ENCOUNTER — Ambulatory Visit: Payer: 59

## 2015-01-20 ENCOUNTER — Other Ambulatory Visit: Payer: Self-pay | Admitting: Gynecology

## 2015-01-21 LAB — CYTOLOGY - PAP

## 2019-01-08 ENCOUNTER — Other Ambulatory Visit: Payer: Self-pay

## 2019-01-08 ENCOUNTER — Ambulatory Visit (INDEPENDENT_AMBULATORY_CARE_PROVIDER_SITE_OTHER): Payer: BLUE CROSS/BLUE SHIELD | Admitting: Allergy and Immunology

## 2019-01-08 ENCOUNTER — Encounter: Payer: Self-pay | Admitting: Allergy and Immunology

## 2019-01-08 VITALS — BP 118/70 | HR 79 | Temp 97.9°F | Resp 18 | Ht 63.0 in | Wt 274.0 lb

## 2019-01-08 DIAGNOSIS — J4541 Moderate persistent asthma with (acute) exacerbation: Secondary | ICD-10-CM

## 2019-01-08 DIAGNOSIS — R05 Cough: Secondary | ICD-10-CM

## 2019-01-08 DIAGNOSIS — H1013 Acute atopic conjunctivitis, bilateral: Secondary | ICD-10-CM | POA: Diagnosis not present

## 2019-01-08 DIAGNOSIS — J4540 Moderate persistent asthma, uncomplicated: Secondary | ICD-10-CM

## 2019-01-08 DIAGNOSIS — J4530 Mild persistent asthma, uncomplicated: Secondary | ICD-10-CM | POA: Insufficient documentation

## 2019-01-08 DIAGNOSIS — J3089 Other allergic rhinitis: Secondary | ICD-10-CM

## 2019-01-08 DIAGNOSIS — R053 Chronic cough: Secondary | ICD-10-CM | POA: Insufficient documentation

## 2019-01-08 HISTORY — DX: Moderate persistent asthma, uncomplicated: J45.40

## 2019-01-08 MED ORDER — CARBINOXAMINE MALEATE 6 MG PO TABS: 6.0000 mg | ORAL_TABLET | Freq: Four times a day (QID) | ORAL | 5 refills | Status: DC | PRN

## 2019-01-08 MED ORDER — BUDESONIDE-FORMOTEROL FUMARATE 160-4.5 MCG/ACT IN AERO: 2.0000 | INHALATION_SPRAY | Freq: Two times a day (BID) | RESPIRATORY_TRACT | 5 refills | Status: DC

## 2019-01-08 MED ORDER — AZELASTINE HCL 0.15 % NA SOLN: 1.0000 | Freq: Two times a day (BID) | NASAL | 5 refills | Status: AC | PRN

## 2019-01-08 MED ORDER — ALBUTEROL SULFATE HFA 108 (90 BASE) MCG/ACT IN AERS: 2.0000 | INHALATION_SPRAY | Freq: Four times a day (QID) | RESPIRATORY_TRACT | 1 refills | Status: DC | PRN

## 2019-01-08 MED ORDER — MONTELUKAST SODIUM 10 MG PO TABS: 10.0000 mg | ORAL_TABLET | Freq: Every day | ORAL | 5 refills | Status: DC

## 2019-01-08 MED ORDER — HYDROCOD POLST-CPM POLST ER 10-8 MG/5ML PO SUER: 5.0000 mL | Freq: Every evening | ORAL | 0 refills | Status: AC | PRN

## 2019-01-08 NOTE — Progress Notes (Signed)
New Patient Note  RE: Stacy Tran MRN: [id number] DOB: Jul 07, 1971 Date of Office Visit: 01/08/2019  Referring provider: No ref. provider found Primary care provider: No primary care provider on file.  Chief Complaint: Cough and Nasal Congestion   History of present illness: Stacy Tran is a 48 y.o. female presenting today for evaluation of persistent cough and rhinitis.  She reports that over the past 2 months she has experienced a persistent cough as well as occasional wheezing and chest tightness.  She believes that this problem is related to pollen allergies as she has experienced similar symptoms during past the spring of past years.  She reports that these symptoms "have gotten worse each year, this year is astronomical."  She also complains of nasal congestion, rhinorrhea, postnasal drainage, and occasional ocular pruritus.  She is currently taking over-the-counter antihistamines twice daily in an attempt to control the symptoms.  She rarely experiences heartburn, 3 times over the past 2 months, and takes Tums when needed.  Assessment and plan: Moderate persistent asthma The patient's history suggests allergen induced asthma/cough variant asthma.  Spirometry reveals significant postbronchodilator reversibility confirming bronchial hyperresponsiveness.  A prescription has been provided for Symbicort (budesonide/formoterol) 160/4.5 g,  2 inhalations twice a day. To maximize pulmonary deposition, a spacer has been provided along with instructions for its proper administration with an HFA inhaler.  A prescription has been provided for montelukast 10 mg daily at bedtime.  The montelukast boxed warning has been discussed.  A prescription has been provided for albuterol HFA, 1 to 2 inhalations every 4-6 hours if needed.  Subjective and objective measures of pulmonary function will be followed and the treatment plan will be adjusted accordingly.  Seasonal and perennial allergic  rhinitis  Aeroallergen avoidance measures have been discussed and provided in written form.  Montelukast has been prescribed (as above).  A prescription has been provided for RyVent (carbinoxamine maleate) 6mg  every 6-8 hours as needed.  A prescription has been provided for azelastine nasal spray, 1-2 sprays per nostril 2 times daily as needed. Proper nasal spray technique has been discussed and demonstrated.   Nasal saline lavage (NeilMed) has been recommended as needed and prior to medicated nasal sprays along with instructions for proper administration.  The risks and benefits of aeroallergen immunotherapy have been discussed. The patient is interested in the possibility of initiating immunotherapy if insurance coverage is favorable. She will let us know how she would like to proceed.  Cough, persistent The most common causes of chronic cough include the following: upper airway cough syndrome (UACS) which is caused by variety of rhinosinus conditions; asthma; gastroesophageal reflux disease (GERD); chronic bronchitis from cigarette smoking or other inhaled environmental irritants; non-asthmatic eosinophilic bronchitis; and bronchiectasis. In prospective studies, these conditions have accounted for up to 94% of the causes of chronic cough in immunocompetent adults. The history and physical examination suggest that her cough is multifactorial with contribution from bronchial hyperresponsiveness and postnasal drainage. We will address these issues at this time.   A prescription has been provided for a flutter valve to be used as needed to break the coughing cycle.  A prescription has been provided for Tussionex ER suspension, 5 mL every 12 hours over the next 3 days, then stop.  Treatment plan as outlined above.    We will regroup in 6 weeks to assess treatment response and adjust therapy accordingly.   Meds ordered this encounter  Medications  . budesonide-formoterol (SYMBICORT) 160-4.5  MCG/ACT inhaler  Sig: Inhale 2 puffs into the lungs 2 (two) times daily.    Dispense:  1 Inhaler    Refill:  5  . montelukast (SINGULAIR) 10 MG tablet    Sig: Take 1 tablet (10 mg total) by mouth at bedtime.    Dispense:  30 tablet    Refill:  5  . albuterol (VENTOLIN HFA) 108 (90 Base) MCG/ACT inhaler    Sig: Inhale 2 puffs into the lungs every 6 (six) hours as needed for wheezing or shortness of breath.    Dispense:  1 Inhaler    Refill:  1  . Carbinoxamine Maleate (RYVENT) 6 MG TABS    Sig: Take 6 mg by mouth every 6 (six) hours as needed.    Dispense:  30 tablet    Refill:  5  . Azelastine HCl 0.15 % SOLN    Sig: Place 1-2 sprays into both nostrils 2 (two) times daily as needed.    Dispense:  30 mL    Refill:  5  . chlorpheniramine-HYDROcodone (TUSSIONEX PENNKINETIC ER) 10-8 MG/5ML SUER    Sig: Take 5 mLs by mouth at bedtime as needed for up to 3 days for cough.    Dispense:  140 mL    Refill:  0    Diagnostics: Spirometry: FVC was 2.39 L and FEV1 is 1.99 L (72% predicted) with significant (450 mL, 23%) postbronchodilator improvement.  Please see scanned spirometry results for details. Epicutaneous testing: Positive to tree pollen and dust mite antigen. Intradermal testing: Positive to grass pollen, molds, cat hair, and dog epithelia.  Physical examination: Blood pressure 118/70, pulse 79, temperature 97.9 F (36.6 C), temperature source Tympanic, resp. rate 18, height  (1.6 m), weight 274 lb (124.3 kg), SpO2 98 %.  General: Alert, interactive, in no acute distress. HEENT: TMs pearly gray, turbinates moderately edematous with clear discharge, post-pharynx moderately erythematous. Neck: Supple without lymphadenopathy. Lungs: Clear to auscultation without wheezing, rhonchi or rales. CV: Normal S1, S2 without murmurs. Abdomen: Nondistended, nontender. Skin: Warm and dry, without lesions or rashes. Extremities:  No clubbing, cyanosis or edema. Neuro:   Grossly  intact.  Review of systems:  Review of systems negative except as noted in HPI / PMHx or noted below: Review of Systems  Constitutional: Negative.   HENT: Negative.   Eyes: Negative.   Respiratory: Negative.   Cardiovascular: Negative.   Gastrointestinal: Negative.   Genitourinary: Negative.   Musculoskeletal: Negative.   Skin: Negative.   Neurological: Negative.   Endo/Heme/Allergies: Negative.   Psychiatric/Behavioral: Negative.     Past medical history:  Past Medical History:  Diagnosis Date  . Diverticulosis   . GERD (gastroesophageal reflux disease)   . Moderate persistent asthma 01/08/2019    Past surgical history:  Past Surgical History:  Procedure Laterality Date  . APPENDECTOMY    . CESAREAN SECTION  2005  . HERNIA REPAIR      Family history: Family History  Problem Relation Age of Onset  . Hypertension Father   . Allergic rhinitis Father   . Diabetes Paternal Uncle   . Heart disease Paternal Aunt     Social history: Social History   Socioeconomic History  . Marital status: Married    Spouse name: Not on file  . Number of children: Not on file  . Years of education: Not on file  . Highest education level: Not on file  Occupational History  . Not on file  Social Needs  . Financial resource strain: Not on  file  . Food insecurity:    Worry: Not on file    Inability: Not on file  . Transportation needs:    Medical: Not on file    Non-medical: Not on file  Tobacco Use  . Smoking status: Never Smoker  . Smokeless tobacco: Never Used  Substance and Sexual Activity  . Alcohol use: Yes    Comment: once a week  . Drug use: No  . Sexual activity: Yes  Lifestyle  . Physical activity:    Days per week: Not on file    Minutes per session: Not on file  . Stress: Not on file  Relationships  . Social connections:    Talks on phone: Not on file    Gets together: Not on file    Attends religious service: Not on file    Active member of club or  organization: Not on file    Attends meetings of clubs or organizations: Not on file    Relationship status: Not on file  . Intimate partner violence:    Fear of current or ex partner: Not on file    Emotionally abused: Not on file    Physically abused: Not on file    Forced sexual activity: Not on file  Other Topics Concern  . Not on file  Social History Narrative  . Not on file   Environmental History: The patient lives in 48 year old house with hardwood floors throughout, gas heat, and central air.  There is no known mold/water damage in the home.  There is a dog in the home which does not have access to her bedroom.  She is a non-smoker.  Allergies as of 01/08/2019      Reactions   Sulfamethoxazole Nausea Only   Dizziness   Doxycycline    Tetracyclines & Related       Medication List       Accurate as of January 08, 2019  4:56 PM. If you have any questions, ask your nurse or doctor.        STOP taking these medications   traMADol 50 MG tablet Commonly known as:  ULTRAM Stopped by:  Wellington Hampshire, MD     TAKE these medications   albuterol 108 (90 Base) MCG/ACT inhaler Commonly known as:  VENTOLIN HFA Inhale 2 puffs into the lungs every 6 (six) hours as needed for wheezing or shortness of breath. Started by:  Wellington Hampshire, MD   Azelastine HCl 0.15 % Soln Place 1-2 sprays into both nostrils 2 (two) times daily as needed. Started by:  Wellington Hampshire, MD   budesonide-formoterol 160-4.5 MCG/ACT inhaler Commonly known as:  Symbicort Inhale 2 puffs into the lungs 2 (two) times daily. Started by:  Wellington Hampshire, MD   Carbinoxamine Maleate 6 MG Tabs Commonly known as:  RyVent Take 6 mg by mouth every 6 (six) hours as needed. Started by:  Wellington Hampshire, MD   chlorpheniramine-HYDROcodone 10-8 MG/5ML Suer Commonly known as:  Tussionex Pennkinetic ER Take 5 mLs by mouth at bedtime as needed for up to 3 days for cough. Started by:  Wellington Hampshire, MD    KLS Aller-Tec 10 MG tablet Generic drug:  cetirizine Take 10 mg by mouth 2 (two) times daily.   montelukast 10 MG tablet Commonly known as:  SINGULAIR Take 1 tablet (10 mg total) by mouth at bedtime. Started by:  Wellington Hampshire, MD       Known medication allergies: Allergies  Allergen Reactions  . Sulfamethoxazole Nausea Only    Dizziness  . Doxycycline   . Tetracyclines & Related     I appreciate the opportunity to take part in Stacy Tran's care. Please do not hesitate to contact me with questions.  Sincerely,   R. Jorene Guestarter Alazia Crocket, MD

## 2019-01-08 NOTE — Patient Instructions (Addendum)
Moderate persistent asthma The patient's history suggests allergen induced asthma/cough variant asthma.  Spirometry reveals significant postbronchodilator reversibility confirming bronchial hyperresponsiveness.  A prescription has been provided for Symbicort (budesonide/formoterol) 160/4.5 g,  2 inhalations twice a day. To maximize pulmonary deposition, a spacer has been provided along with instructions for its proper administration with an HFA inhaler.  A prescription has been provided for montelukast 10 mg daily at bedtime.  The montelukast boxed warning has been discussed.  A prescription has been provided for albuterol HFA, 1 to 2 inhalations every 4-6 hours if needed.  Subjective and objective measures of pulmonary function will be followed and the treatment plan will be adjusted accordingly.  Seasonal and perennial allergic rhinitis  Aeroallergen avoidance measures have been discussed and provided in written form.  Montelukast has been prescribed (as above).  A prescription has been provided for RyVent (carbinoxamine maleate) 6mg  every 6-8 hours as needed.  A prescription has been provided for azelastine nasal spray, 1-2 sprays per nostril 2 times daily as needed. Proper nasal spray technique has been discussed and demonstrated.   Nasal saline lavage (NeilMed) has been recommended as needed and prior to medicated nasal sprays along with instructions for proper administration.  The risks and benefits of aeroallergen immunotherapy have been discussed. The patient is interested in the possibility of initiating immunotherapy if insurance coverage is favorable. She will let us know how she would like to proceed.  Cough, persistent The most common causes of chronic cough include the following: upper airway cough syndrome (UACS) which is caused by variety of rhinosinus conditions; asthma; gastroesophageal reflux disease (GERD); chronic bronchitis from cigarette smoking or other inhaled  environmental irritants; non-asthmatic eosinophilic bronchitis; and bronchiectasis. In prospective studies, these conditions have accounted for up to 94% of the causes of chronic cough in immunocompetent adults. The history and physical examination suggest that her cough is multifactorial with contribution from bronchial hyperresponsiveness and postnasal drainage. We will address these issues at this time.   A prescription has been provided for a flutter valve to be used as needed to break the coughing cycle.  A prescription has been provided for Tussionex ER suspension, 5 mL every 12 hours over the next 3 days, then stop.  Treatment plan as outlined above.    We will regroup in 6 weeks to assess treatment response and adjust therapy accordingly.   Return in about 6 weeks (around 02/19/2019), or if symptoms worsen or fail to improve.  Control of House Dust Mite Allergen  House dust mites play a major role in allergic asthma and rhinitis.  They occur in environments with high humidity wherever human skin, the food for dust mites is found. High levels have been detected in dust obtained from mattresses, pillows, carpets, upholstered furniture, bed covers, clothes and soft toys.  The principal allergen of the house dust mite is found in its feces.  A gram of dust may contain 1,000 mites and 250,000 fecal particles.  Mite antigen is easily measured in the air during house cleaning activities.    1. Encase mattresses, including the box spring, and pillow, in an air tight cover.  Seal the zipper end of the encased mattresses with wide adhesive tape. 2. Wash the bedding in water of 130 degrees Farenheit weekly.  Avoid cotton comforters/quilts and flannel bedding: the most ideal bed covering is the dacron comforter. 3. Remove all upholstered furniture from the bedroom. 4. Remove carpets, carpet padding, rugs, and non-washable window drapes from the bedroom.  Wash  drapes weekly or use plastic window  coverings. 5. Remove all non-washable stuffed toys from the bedroom.  Wash stuffed toys weekly. 6. Have the room cleaned frequently with a vacuum cleaner and a damp dust-mop.  The patient should not be in a room which is being cleaned and should wait 1 hour after cleaning before going into the room. 7. Close and seal all heating outlets in the bedroom.  Otherwise, the room will become filled with dust-laden air.  An electric heater can be used to heat the room. 8. Reduce indoor humidity to less than 50%.  Do not use a humidifier.  Reducing Pollen Exposure  The American Academy of Allergy, Asthma and Immunology suggests the following steps to reduce your exposure to pollen during allergy seasons.    1. Do not hang sheets or clothing out to dry; pollen may collect on these items. 2. Do not mow lawns or spend time around freshly cut grass; mowing stirs up pollen. 3. Keep windows closed at night.  Keep car windows closed while driving. 4. Minimize morning activities outdoors, a time when pollen counts are usually at their highest. 5. Stay indoors as much as possible when pollen counts or humidity is high and on windy days when pollen tends to remain in the air longer. 6. Use air conditioning when possible.  Many air conditioners have filters that trap the pollen spores. 7. Use a HEPA room air filter to remove pollen form the indoor air you breathe.   Control of Dog or Cat Allergen  Avoidance is the best way to manage a dog or cat allergy. If you have a dog or cat and are allergic to dog or cats, consider removing the dog or cat from the home. If you have a dog or cat but don't want to find it a new home, or if your family wants a pet even though someone in the household is allergic, here are some strategies that may help keep symptoms at bay:  1. Keep the pet out of your bedroom and restrict it to only a few rooms. Be advised that keeping the dog or cat in only one room will not limit the  allergens to that room. 2. Don't pet, hug or kiss the dog or cat; if you do, wash your hands with soap and water. 3. High-efficiency particulate air (HEPA) cleaners run continuously in a bedroom or living room can reduce allergen levels over time. 4. Place electrostatic material sheet in the air inlet vent in the bedroom. 5. Regular use of a high-efficiency vacuum cleaner or a central vacuum can reduce allergen levels. 6. Giving your dog or cat a bath at least once a week can reduce airborne allergen.  Control of Mold Allergen  Mold and fungi can grow on a variety of surfaces provided certain temperature and moisture conditions exist.  Outdoor molds grow on plants, decaying vegetation and soil.  The major outdoor mold, Alternaria and Cladosporium, are found in very high numbers during hot and dry conditions.  Generally, a late Summer - Fall peak is seen for common outdoor fungal spores.  Rain will temporarily lower outdoor mold spore count, but counts rise rapidly when the rainy period ends.  The most important indoor molds are Aspergillus and Penicillium.  Dark, humid and poorly ventilated basements are ideal sites for mold growth.  The next most common sites of mold growth are the bathroom and the kitchen.  Outdoor [employer] 1. Use air conditioning and keep windows closed  2. Avoid exposure to decaying vegetation. 3. Avoid leaf raking. 4. Avoid grain handling. 5. Consider wearing a face mask if working in moldy areas.  Indoor Mold Control 1. Maintain humidity below 50%. 2. Clean washable surfaces with 5% bleach solution. 3. Remove sources e.g. Contaminated carpets.

## 2019-01-08 NOTE — Assessment & Plan Note (Signed)
The patient's history suggests allergen induced asthma/cough variant asthma.  Spirometry reveals significant postbronchodilator reversibility confirming bronchial hyperresponsiveness.  A prescription has been provided for Symbicort (budesonide/formoterol) 160/4.5 g, 2 inhalations twice a day. To maximize pulmonary deposition, a spacer has been provided along with instructions for its proper administration with an HFA inhaler.  A prescription has been provided for montelukast 10 mg daily at bedtime.  The montelukast boxed warning has been discussed.  A prescription has been provided for albuterol HFA, 1 to 2 inhalations every 4-6 hours if needed.  Subjective and objective measures of pulmonary function will be followed and the treatment plan will be adjusted accordingly.

## 2019-01-08 NOTE — Assessment & Plan Note (Signed)
The most common causes of chronic cough include the following: upper airway cough syndrome (UACS) which is caused by variety of rhinosinus conditions; asthma; gastroesophageal reflux disease (GERD); chronic bronchitis from cigarette smoking or other inhaled environmental irritants; non-asthmatic eosinophilic bronchitis; and bronchiectasis. In prospective studies, these conditions have accounted for up to 94% of the causes of chronic cough in immunocompetent adults. The history and physical examination suggest that her cough is multifactorial with contribution from bronchial hyperresponsiveness and postnasal drainage. We will address these issues at this time.   A prescription has been provided for a flutter valve to be used as needed to break the coughing cycle.  A prescription has been provided for Tussionex ER suspension, 5 mL every 12 hours over the next 3 days, then stop.  Treatment plan as outlined above.    We will regroup in 6 weeks to assess treatment response and adjust therapy accordingly.

## 2019-01-08 NOTE — Assessment & Plan Note (Addendum)
   Aeroallergen avoidance measures have been discussed and provided in written form.  Montelukast has been prescribed (as above).  A prescription has been provided for RyVent (carbinoxamine maleate) 6mg  every 6-8 hours as needed.  A prescription has been provided for azelastine nasal spray, 1-2 sprays per nostril 2 times daily as needed. Proper nasal spray technique has been discussed and demonstrated.   Nasal saline lavage (NeilMed) has been recommended as needed and prior to medicated nasal sprays along with instructions for proper administration.  The risks and benefits of aeroallergen immunotherapy have been discussed. The patient is interested in the possibility of initiating immunotherapy if insurance coverage is favorable. She will let us know how she would like to proceed.

## 2019-01-11 DIAGNOSIS — J3089 Other allergic rhinitis: Secondary | ICD-10-CM

## 2019-01-11 NOTE — Progress Notes (Signed)
VIALS EXP 01-11-2020

## 2019-01-15 DIAGNOSIS — J301 Allergic rhinitis due to pollen: Secondary | ICD-10-CM

## 2019-01-23 ENCOUNTER — Ambulatory Visit (INDEPENDENT_AMBULATORY_CARE_PROVIDER_SITE_OTHER): Payer: BC Managed Care – PPO | Admitting: *Deleted

## 2019-01-23 ENCOUNTER — Other Ambulatory Visit: Payer: Self-pay

## 2019-01-23 DIAGNOSIS — J309 Allergic rhinitis, unspecified: Secondary | ICD-10-CM

## 2019-01-23 MED ORDER — EPINEPHRINE 0.3 MG/0.3ML IJ SOAJ: 0.3000 mg | Freq: Once | INTRAMUSCULAR | 2 refills | Status: AC

## 2019-01-23 MED ORDER — CARBINOXAMINE MALEATE 6 MG PO TABS: 1.0000 | ORAL_TABLET | Freq: Four times a day (QID) | ORAL | 5 refills | Status: DC | PRN

## 2019-01-23 NOTE — Progress Notes (Signed)
Immunotherapy   Patient Details  Name: Stacy Tran MRN: [id number] Date of Birth: 06/08/1971  01/23/2019  [name] started injections for  M-C-D, G-T-DM Following schedule: A  Frequency: 1-2X Weekly 72 hours apart Epi-Pen: Yes Consent signed and patient instructions given. Patient received .70ml of M-C-D in RUA and .24ml of G-T-DM in the LUA. Patient waited 30 minutes and did not experience any issues.    Ashleigh Fernandez-Vernon 01/23/2019, 3:55 PM

## 2019-01-25 MED ORDER — CARBINOXAMINE MALEATE 4 MG PO TABS: 1.0000 | ORAL_TABLET | Freq: Two times a day (BID) | ORAL | 1 refills | Status: DC | PRN

## 2019-01-30 ENCOUNTER — Ambulatory Visit (INDEPENDENT_AMBULATORY_CARE_PROVIDER_SITE_OTHER): Payer: BC Managed Care – PPO | Admitting: *Deleted

## 2019-01-30 DIAGNOSIS — J309 Allergic rhinitis, unspecified: Secondary | ICD-10-CM

## 2019-02-06 ENCOUNTER — Ambulatory Visit (INDEPENDENT_AMBULATORY_CARE_PROVIDER_SITE_OTHER): Payer: BC Managed Care – PPO | Admitting: *Deleted

## 2019-02-06 DIAGNOSIS — J309 Allergic rhinitis, unspecified: Secondary | ICD-10-CM

## 2019-02-13 ENCOUNTER — Ambulatory Visit (INDEPENDENT_AMBULATORY_CARE_PROVIDER_SITE_OTHER): Payer: BC Managed Care – PPO | Admitting: *Deleted

## 2019-02-13 DIAGNOSIS — J309 Allergic rhinitis, unspecified: Secondary | ICD-10-CM

## 2019-02-16 MED ORDER — CARBINOXAMINE MALEATE 4 MG PO TABS: 1.0000 | ORAL_TABLET | Freq: Two times a day (BID) | ORAL | 1 refills | Status: DC | PRN

## 2019-02-19 ENCOUNTER — Other Ambulatory Visit: Payer: Self-pay

## 2019-02-19 ENCOUNTER — Encounter: Payer: Self-pay | Admitting: Allergy and Immunology

## 2019-02-19 ENCOUNTER — Ambulatory Visit (INDEPENDENT_AMBULATORY_CARE_PROVIDER_SITE_OTHER): Payer: BC Managed Care – PPO | Admitting: Allergy and Immunology

## 2019-02-19 VITALS — BP 140/88 | HR 90 | Temp 98.3°F | Resp 16 | Ht 63.0 in | Wt 275.0 lb

## 2019-02-19 DIAGNOSIS — J309 Allergic rhinitis, unspecified: Secondary | ICD-10-CM

## 2019-02-19 DIAGNOSIS — R053 Chronic cough: Secondary | ICD-10-CM

## 2019-02-19 DIAGNOSIS — K219 Gastro-esophageal reflux disease without esophagitis: Secondary | ICD-10-CM | POA: Diagnosis not present

## 2019-02-19 DIAGNOSIS — R05 Cough: Secondary | ICD-10-CM

## 2019-02-19 DIAGNOSIS — J3089 Other allergic rhinitis: Secondary | ICD-10-CM | POA: Diagnosis not present

## 2019-02-19 DIAGNOSIS — J4540 Moderate persistent asthma, uncomplicated: Secondary | ICD-10-CM

## 2019-02-19 MED ORDER — FAMOTIDINE 20 MG PO TABS: 20.0000 mg | ORAL_TABLET | Freq: Two times a day (BID) | ORAL | 5 refills | Status: DC

## 2019-02-19 NOTE — Assessment & Plan Note (Signed)
Multifactorial.    Treatment plan as outlined above. 

## 2019-02-19 NOTE — Assessment & Plan Note (Signed)
   Appropriate reflux lifestyle modifications have been provided.  A prescription has been provided for famotidine (Pepcid), 20 mg twice daily.

## 2019-02-19 NOTE — Assessment & Plan Note (Signed)
   For now, continue Symbicort (budesonide/formoterol) 160/4.5 g, 2 inhalations via spacer device twice a day,  montelukast 10 mg daily at bedtime, and albuterol HFA, 1 to 2 inhalations every 4-6 hours if needed.  Subjective and objective measures of pulmonary function will be followed and the treatment plan will be adjusted accordingly.

## 2019-02-19 NOTE — Assessment & Plan Note (Signed)
   Continue appropriate allergen avoidance measures, immunotherapy injections per protocol, azelastine nasal spray as needed, carbinoxamine as needed, and montelukast daily.  Nasal saline spray (i.e., Simply Saline) or nasal saline lavage (i.e., NeilMed) is recommended as needed and prior to medicated nasal sprays.  Medications will be decreased as symptom relief from immunotherapy becomes evident.

## 2019-02-19 NOTE — Progress Notes (Signed)
Follow-up Note  RE: Stacy Tran MRN: [id number] DOB: 26-Nov-1970 Date of Office Visit: 02/19/2019  Primary care provider: Patient, No Pcp Per Referring provider: No ref. provider found  History of present illness: Stacy Tran is a 48 y.o. female with persistent asthma, allergic rhinoconjunctivitis, and history of persistent cough presenting today for a sick visit.  She was previously seen in this clinic for her initial evaluation on January 08, 2019.  She reports that, overall, her cough has improved significantly and that she is no longer "coughing all the time."  She has been compliant with medications as prescribed on her last visit.  However, she reports that when she is exposed to pollen, dust, or mold, the cough becomes problematic once again.  She reports that she did have "a major increase" and acid reflux over the past few weeks.  She was put on a proton pump inhibitor in the past and is not willing to go back on a PPI again due to potential side effects.  She started aeroallergen immunotherapy and has been receiving buildup injections without problems or complications.   Assessment and plan: Moderate persistent asthma  For now, continue Symbicort (budesonide/formoterol) 160/4.5 g, 2 inhalations via spacer device twice a day,  montelukast 10 mg daily at bedtime, and albuterol HFA, 1 to 2 inhalations every 4-6 hours if needed.  Subjective and objective measures of pulmonary function will be followed and the treatment plan will be adjusted accordingly.  Seasonal and perennial allergic rhinitis  Continue appropriate allergen avoidance measures, immunotherapy injections per protocol, azelastine nasal spray as needed, carbinoxamine as needed, and montelukast daily.  Nasal saline spray (i.e., Simply Saline) or nasal saline lavage (i.e., NeilMed) is recommended as needed and prior to medicated nasal sprays.  Medications will be decreased as symptom relief from immunotherapy  becomes evident.  Acid reflux  Appropriate reflux lifestyle modifications have been provided.  A prescription has been provided for famotidine (Pepcid), 20 mg twice daily.  Cough, persistent Multifactorial.  Treatment plan as outlined above.   Meds ordered this encounter  Medications  . famotidine (PEPCID) 20 MG tablet    Sig: Take 1 tablet (20 mg total) by mouth 2 (two) times daily.    Dispense:  60 tablet    Refill:  5    Diagnostics: Spirometry:  Normal with an FEV1 of 95% predicted with an FEV1 ratio of 103%.  FEV1 is significantly improved compared with previous study.  Please see scanned spirometry results for details.    Physical examination: Blood pressure 140/88, pulse 90, temperature 98.3 F (36.8 C), temperature source Temporal, resp. rate 16, height 5\' 3"  (1.6 m), weight 275 lb (124.7 kg), SpO2 96 %.  General: Alert, interactive, in no acute distress. HEENT: TMs pearly gray, turbinates mildly edematous with clear discharge, post-pharynx mildly erythematous. Neck: Supple without lymphadenopathy. Lungs: Clear to auscultation without wheezing, rhonchi or rales. CV: Normal S1, S2 without murmurs. Skin: Warm and dry, without lesions or rashes.  The following portions of the patient's history were reviewed and updated as appropriate: allergies, current medications, past family history, past medical history, past social history, past surgical history and problem list.  Allergies as of 02/19/2019      Reactions   Sulfamethoxazole Nausea Only   Dizziness   Doxycycline    Tetracyclines & Related       Medication List       Accurate as of February 19, 2019  6:09 PM. If you have any questions,  ask your nurse or doctor.        STOP taking these medications   KLS Aller-Tec 10 MG tablet Generic drug: cetirizine Stopped by: Wellington Hampshire Carter Channa Hazelett, MD     TAKE these medications   albuterol 108 (90 Base) MCG/ACT inhaler Commonly known as: VENTOLIN HFA Inhale 2 puffs into  the lungs every 6 (six) hours as needed for wheezing or shortness of breath.   Azelastine HCl 0.15 % Soln Place 1-2 sprays into both nostrils 2 (two) times daily as needed.   budesonide-formoterol 160-4.5 MCG/ACT inhaler Commonly known as: Symbicort Inhale 2 puffs into the lungs 2 (two) times daily.   Carbinoxamine Maleate 6 MG Tabs Commonly known as: RyVent Take 1 tablet by mouth every 6 (six) hours as needed. What changed: Another medication with the same name was removed. Continue taking this medication, and follow the directions you see here. Changed by: Wellington Hampshire Carter Otilio Groleau, MD   Carbinoxamine Maleate 4 MG Tabs Take 1 tablet (4 mg total) by mouth 2 (two) times daily as needed (allergies). What changed: Another medication with the same name was removed. Continue taking this medication, and follow the directions you see here. Changed by: Wellington Hampshire Carter Laqueta Bonaventura, MD   famotidine 20 MG tablet Commonly known as: Pepcid Take 1 tablet (20 mg total) by mouth 2 (two) times daily. Started by: Wellington Hampshire Carter Tonnya Garbett, MD   montelukast 10 MG tablet Commonly known as: SINGULAIR Take 1 tablet (10 mg total) by mouth at bedtime.       Allergies  Allergen Reactions  . Sulfamethoxazole Nausea Only    Dizziness  . Doxycycline   . Tetracyclines & Related     Review of systems: Review of systems negative except as noted in HPI / PMHx or noted below: Constitutional: Negative.  HENT: Negative.   Eyes: Negative.  Respiratory: Negative.   Cardiovascular: Negative.  Gastrointestinal: Negative.  Genitourinary: Negative.  Musculoskeletal: Negative.  Neurological: Negative.  Endo/Heme/Allergies: Negative.  Cutaneous: Negative.   Past Medical History:  Diagnosis Date  . Diverticulosis   . GERD (gastroesophageal reflux disease)   . Moderate persistent asthma 01/08/2019    Family History  Problem Relation Age of Onset  . Hypertension Father   . Allergic rhinitis Father   . Diabetes Paternal Uncle    . Heart disease Paternal Aunt     Social History   Socioeconomic History  . Marital status: Married    Spouse name: Not on file  . Number of children: Not on file  . Years of education: Not on file  . Highest education level: Not on file  Occupational History  . Not on file  Social Needs  . Financial resource strain: Not on file  . Food insecurity    Worry: Not on file    Inability: Not on file  . Transportation needs    Medical: Not on file    Non-medical: Not on file  Tobacco Use  . Smoking status: Never Smoker  . Smokeless tobacco: Never Used  Substance and Sexual Activity  . Alcohol use: Yes    Comment: once a week  . Drug use: No  . Sexual activity: Yes  Lifestyle  . Physical activity    Days per week: Not on file    Minutes per session: Not on file  . Stress: Not on file  Relationships  . Social Musicianconnections    Talks on phone: Not on file    Gets together: Not on file    Attends  religious service: Not on file    Active member of club or organization: Not on file    Attends meetings of clubs or organizations: Not on file    Relationship status: Not on file  . Intimate partner violence    Fear of current or ex partner: Not on file    Emotionally abused: Not on file    Physically abused: Not on file    Forced sexual activity: Not on file  Other Topics Concern  . Not on file  Social History Narrative  . Not on file    I appreciate the opportunity to take part in Stacy Tran's care. Please do not hesitate to contact me with questions.  Sincerely,   R. Jorene Guestarter Aeisha Minarik, MD

## 2019-02-19 NOTE — Patient Instructions (Addendum)
Moderate persistent asthma  For now, continue Symbicort (budesonide/formoterol) 160/4.5 g, 2 inhalations via spacer device twice a day,  montelukast 10 mg daily at bedtime, and albuterol HFA, 1 to 2 inhalations every 4-6 hours if needed.  Subjective and objective measures of pulmonary function will be followed and the treatment plan will be adjusted accordingly.  Seasonal and perennial allergic rhinitis  Continue appropriate allergen avoidance measures, immunotherapy injections per protocol, azelastine nasal spray as needed, carbinoxamine as needed, and montelukast daily.  Nasal saline spray (i.e., Simply Saline) or nasal saline lavage (i.e., NeilMed) is recommended as needed and prior to medicated nasal sprays.  Medications will be decreased as symptom relief from immunotherapy becomes evident.  Acid reflux  Appropriate reflux lifestyle modifications have been provided.  A prescription has been provided for famotidine (Pepcid), 20 mg twice daily.  Cough, persistent Multifactorial.  Treatment plan as outlined above.   Return in about 4 months (around 06/22/2019), or if symptoms worsen or fail to improve.  Lifestyle Changes for Controlling GERD  When you have GERD, stomach acid feels as if it's backing up toward your mouth. Whether or not you take medication to control your GERD, your symptoms can often be improved with lifestyle changes.   Raise Your Head  Reflux is more likely to strike when you're lying down flat, because stomach fluid can  flow backward more easily. Raising the head of your bed 4-6 inches can help. To do this:  Slide blocks or books under the legs at the head of your bed. Or, place a wedge under  the mattress. Many foam stores can make a suitable wedge for you. The wedge  should run from your waist to the top of your head.  Don't just prop your head on several pillows. This increases pressure on your  stomach. It can make GERD worse.  Watch Your  Eating Habits Certain foods may increase the acid in your stomach or relax the lower esophageal sphincter, making GERD more likely. It's best to avoid the following:  Coffee, tea, and carbonated drinks (with and without caffeine)  Fatty, fried, or spicy food  Mint, chocolate, onions, and tomatoes  Any other foods that seem to irritate your stomach or cause you pain  Relieve the Pressure  Eat smaller meals, even if you have to eat more often.  Don't lie down right after you eat. Wait a few hours for your stomach to empty.  Avoid tight belts and tight-fitting clothes.  Lose excess weight.  Tobacco and Alcohol  Avoid smoking tobacco and drinking alcohol. They can make GERD symptoms worse.

## 2019-02-28 ENCOUNTER — Other Ambulatory Visit: Payer: Self-pay | Admitting: Allergy and Immunology

## 2019-02-28 ENCOUNTER — Ambulatory Visit (INDEPENDENT_AMBULATORY_CARE_PROVIDER_SITE_OTHER): Payer: BC Managed Care – PPO | Admitting: *Deleted

## 2019-02-28 DIAGNOSIS — J309 Allergic rhinitis, unspecified: Secondary | ICD-10-CM

## 2019-03-06 ENCOUNTER — Ambulatory Visit (INDEPENDENT_AMBULATORY_CARE_PROVIDER_SITE_OTHER): Payer: BC Managed Care – PPO

## 2019-03-06 DIAGNOSIS — J309 Allergic rhinitis, unspecified: Secondary | ICD-10-CM | POA: Diagnosis not present

## 2019-03-08 ENCOUNTER — Other Ambulatory Visit: Payer: Self-pay | Admitting: Allergy and Immunology

## 2019-03-13 ENCOUNTER — Ambulatory Visit (INDEPENDENT_AMBULATORY_CARE_PROVIDER_SITE_OTHER): Payer: BC Managed Care – PPO | Admitting: *Deleted

## 2019-03-13 DIAGNOSIS — J309 Allergic rhinitis, unspecified: Secondary | ICD-10-CM | POA: Diagnosis not present

## 2019-03-16 ENCOUNTER — Other Ambulatory Visit: Payer: Self-pay | Admitting: Allergy and Immunology

## 2019-03-22 MED ORDER — CARBINOXAMINE MALEATE 4 MG PO TABS: 1.0000 | ORAL_TABLET | Freq: Two times a day (BID) | ORAL | 3 refills | Status: DC | PRN

## 2019-03-23 ENCOUNTER — Ambulatory Visit (INDEPENDENT_AMBULATORY_CARE_PROVIDER_SITE_OTHER): Payer: BC Managed Care – PPO | Admitting: *Deleted

## 2019-03-23 DIAGNOSIS — J309 Allergic rhinitis, unspecified: Secondary | ICD-10-CM | POA: Diagnosis not present

## 2019-03-26 ENCOUNTER — Ambulatory Visit (INDEPENDENT_AMBULATORY_CARE_PROVIDER_SITE_OTHER): Payer: BC Managed Care – PPO | Admitting: *Deleted

## 2019-03-26 ENCOUNTER — Ambulatory Visit: Payer: Self-pay | Admitting: Allergy and Immunology

## 2019-03-26 DIAGNOSIS — J309 Allergic rhinitis, unspecified: Secondary | ICD-10-CM | POA: Diagnosis not present

## 2019-04-19 ENCOUNTER — Ambulatory Visit (INDEPENDENT_AMBULATORY_CARE_PROVIDER_SITE_OTHER): Payer: BC Managed Care – PPO | Admitting: *Deleted

## 2019-04-19 DIAGNOSIS — J309 Allergic rhinitis, unspecified: Secondary | ICD-10-CM | POA: Diagnosis not present

## 2019-04-24 ENCOUNTER — Ambulatory Visit (INDEPENDENT_AMBULATORY_CARE_PROVIDER_SITE_OTHER): Payer: BC Managed Care – PPO | Admitting: *Deleted

## 2019-04-24 DIAGNOSIS — J309 Allergic rhinitis, unspecified: Secondary | ICD-10-CM | POA: Diagnosis not present

## 2019-04-28 ENCOUNTER — Other Ambulatory Visit: Payer: Self-pay

## 2019-04-28 DIAGNOSIS — Z20822 Contact with and (suspected) exposure to covid-19: Secondary | ICD-10-CM

## 2019-04-29 LAB — NOVEL CORONAVIRUS, NAA: SARS-CoV-2, NAA: NOT DETECTED

## 2019-05-01 ENCOUNTER — Ambulatory Visit (INDEPENDENT_AMBULATORY_CARE_PROVIDER_SITE_OTHER): Payer: BC Managed Care – PPO | Admitting: *Deleted

## 2019-05-01 DIAGNOSIS — J309 Allergic rhinitis, unspecified: Secondary | ICD-10-CM

## 2019-05-05 ENCOUNTER — Other Ambulatory Visit: Payer: Self-pay | Admitting: Allergy and Immunology

## 2019-05-07 ENCOUNTER — Ambulatory Visit (INDEPENDENT_AMBULATORY_CARE_PROVIDER_SITE_OTHER): Payer: BC Managed Care – PPO

## 2019-05-07 DIAGNOSIS — J309 Allergic rhinitis, unspecified: Secondary | ICD-10-CM | POA: Diagnosis not present

## 2019-05-14 ENCOUNTER — Ambulatory Visit (INDEPENDENT_AMBULATORY_CARE_PROVIDER_SITE_OTHER): Payer: BC Managed Care – PPO | Admitting: *Deleted

## 2019-05-14 DIAGNOSIS — J309 Allergic rhinitis, unspecified: Secondary | ICD-10-CM | POA: Diagnosis not present

## 2019-05-17 ENCOUNTER — Ambulatory Visit (INDEPENDENT_AMBULATORY_CARE_PROVIDER_SITE_OTHER): Payer: BC Managed Care – PPO | Admitting: *Deleted

## 2019-05-17 DIAGNOSIS — J309 Allergic rhinitis, unspecified: Secondary | ICD-10-CM | POA: Diagnosis not present

## 2019-05-28 ENCOUNTER — Ambulatory Visit (INDEPENDENT_AMBULATORY_CARE_PROVIDER_SITE_OTHER): Payer: BC Managed Care – PPO

## 2019-05-28 DIAGNOSIS — J309 Allergic rhinitis, unspecified: Secondary | ICD-10-CM | POA: Diagnosis not present

## 2019-05-31 ENCOUNTER — Ambulatory Visit (INDEPENDENT_AMBULATORY_CARE_PROVIDER_SITE_OTHER): Payer: BC Managed Care – PPO | Admitting: *Deleted

## 2019-05-31 DIAGNOSIS — J309 Allergic rhinitis, unspecified: Secondary | ICD-10-CM | POA: Diagnosis not present

## 2019-06-14 ENCOUNTER — Ambulatory Visit (INDEPENDENT_AMBULATORY_CARE_PROVIDER_SITE_OTHER): Payer: BC Managed Care – PPO | Admitting: *Deleted

## 2019-06-14 DIAGNOSIS — J309 Allergic rhinitis, unspecified: Secondary | ICD-10-CM | POA: Diagnosis not present

## 2019-06-19 ENCOUNTER — Ambulatory Visit (INDEPENDENT_AMBULATORY_CARE_PROVIDER_SITE_OTHER): Payer: BC Managed Care – PPO | Admitting: *Deleted

## 2019-06-19 DIAGNOSIS — J309 Allergic rhinitis, unspecified: Secondary | ICD-10-CM | POA: Diagnosis not present

## 2019-06-22 ENCOUNTER — Ambulatory Visit (INDEPENDENT_AMBULATORY_CARE_PROVIDER_SITE_OTHER): Payer: BC Managed Care – PPO

## 2019-06-22 DIAGNOSIS — J309 Allergic rhinitis, unspecified: Secondary | ICD-10-CM

## 2019-06-25 ENCOUNTER — Other Ambulatory Visit: Payer: Self-pay

## 2019-06-25 ENCOUNTER — Ambulatory Visit (INDEPENDENT_AMBULATORY_CARE_PROVIDER_SITE_OTHER): Payer: BC Managed Care – PPO | Admitting: Allergy and Immunology

## 2019-06-25 ENCOUNTER — Encounter: Payer: Self-pay | Admitting: Allergy and Immunology

## 2019-06-25 VITALS — BP 126/74 | HR 95 | Temp 97.5°F | Resp 16 | Ht 62.0 in | Wt 266.0 lb

## 2019-06-25 DIAGNOSIS — J309 Allergic rhinitis, unspecified: Secondary | ICD-10-CM | POA: Diagnosis not present

## 2019-06-25 DIAGNOSIS — H1010 Acute atopic conjunctivitis, unspecified eye: Secondary | ICD-10-CM | POA: Insufficient documentation

## 2019-06-25 DIAGNOSIS — H1013 Acute atopic conjunctivitis, bilateral: Secondary | ICD-10-CM

## 2019-06-25 DIAGNOSIS — J3089 Other allergic rhinitis: Secondary | ICD-10-CM | POA: Diagnosis not present

## 2019-06-25 DIAGNOSIS — K219 Gastro-esophageal reflux disease without esophagitis: Secondary | ICD-10-CM

## 2019-06-25 DIAGNOSIS — J4530 Mild persistent asthma, uncomplicated: Secondary | ICD-10-CM | POA: Diagnosis not present

## 2019-06-25 MED ORDER — PAZEO 0.7 % OP SOLN: 1.0000 [drp] | OPHTHALMIC | 3 refills | Status: DC

## 2019-06-25 NOTE — Assessment & Plan Note (Signed)
Well-controlled currently.  Continue albuterol HFA, 1 to 2 inhalations every 4-6 hours if needed.  During respiratory tract infections and asthma flares, add Symbicort 160-4.5 g, 2 inhalations via spacer device twice daily until symptoms have returned to baseline.  Subjective and objective measures of pulmonary function will be followed and the treatment plan will be adjusted accordingly.

## 2019-06-25 NOTE — Assessment & Plan Note (Signed)
   Continue appropriate allergen avoidance measures, immunotherapy injections per protocol, azelastine nasal spray as needed, and RyVent (carbinoxamine) as needed.  Nasal saline spray (i.e., Simply Saline) or nasal saline lavage (i.e., NeilMed) is recommended as needed and prior to medicated nasal sprays.  Medications will be decreased as symptom relief from immunotherapy becomes evident.

## 2019-06-25 NOTE — Assessment & Plan Note (Signed)
   Continue appropriate reflux lifestyle modifications.  If needed, add famotidine (Pepcid) 20 mg 1-2 times daily. 

## 2019-06-25 NOTE — Addendum Note (Signed)
Addended by: Golda Acre C on: 06/25/2019 08:14 PM   Modules accepted: Level of Service

## 2019-06-25 NOTE — Patient Instructions (Addendum)
Mild persistent asthma Well-controlled currently.  Continue albuterol HFA, 1 to 2 inhalations every 4-6 hours if needed.  During respiratory tract infections and asthma flares, add Symbicort 160-4.5 g, 2 inhalations via spacer device twice daily until symptoms have returned to baseline.  Subjective and objective measures of pulmonary function will be followed and the treatment plan will be adjusted accordingly.  Seasonal and perennial allergic rhinitis  Continue appropriate allergen avoidance measures, immunotherapy injections per protocol, azelastine nasal spray as needed, and RyVent (carbinoxamine) as needed.  Nasal saline spray (i.e., Simply Saline) or nasal saline lavage (i.e., NeilMed) is recommended as needed and prior to medicated nasal sprays.  Medications will be decreased as symptom relief from immunotherapy becomes evident.  Acid reflux  Continue appropriate reflux lifestyle modifications.  If needed, add famotidine (Pepcid) 20 mg 1-2 times daily.  Allergic conjunctivitis  Treatment plan as outlined above for allergic rhinitis.  A prescription has been provided for Pazeo, one drop per eye daily as needed.  Eye lubricant drops (i.e., Natural Tears) if needed.   Return in about 4 months (around 10/23/2019), or if symptoms worsen or fail to improve.

## 2019-06-25 NOTE — Progress Notes (Signed)
Follow-up Note  RE: Stacy Tran MRN: [id number] DOB: 08/15/1970 Date of Office Visit: 06/25/2019  Primary care provider: Patient, No Pcp Per Referring provider: No ref. provider found  History of present illness: Stacy Tran is a 48 y.o. female with persistent asthma, allergic rhinoconjunctivitis, and history of persistent cough presenting today for follow-up.  She was last seen in this clinic on February 19, 2019.  She has been receiving immunotherapy injections without problems or complications.  Other than occasional "eye goopiness" her nasal and ocular symptoms are well controlled.  She takes carbinoxamine at bedtime, otherwise is requiring no allergy medication.  She reports that in interval since her previous visit she has not required asthma rescue medication, experienced nocturnal awakenings due to lower respiratory symptoms, nor have activities of daily living been limited.  She reports that her reflux has been well controlled with diet modification.  She currently does not require an antacid.  Assessment and plan: Mild persistent asthma Well-controlled currently.  Continue albuterol HFA, 1 to 2 inhalations every 4-6 hours if needed.  During respiratory tract infections and asthma flares, add Symbicort 160-4.5 g, 2 inhalations via spacer device twice daily until symptoms have returned to baseline.  Subjective and objective measures of pulmonary function will be followed and the treatment plan will be adjusted accordingly.  Seasonal and perennial allergic rhinitis  Continue appropriate allergen avoidance measures, immunotherapy injections per protocol, azelastine nasal spray as needed, and RyVent (carbinoxamine) as needed.  Nasal saline spray (i.e., Simply Saline) or nasal saline lavage (i.e., NeilMed) is recommended as needed and prior to medicated nasal sprays.  Medications will be decreased as symptom relief from immunotherapy becomes evident.  Acid reflux   Continue appropriate reflux lifestyle modifications.  If needed, add famotidine (Pepcid) 20 mg 1-2 times daily.  Allergic conjunctivitis  Treatment plan as outlined above for allergic rhinitis.  A prescription has been provided for Pazeo, one drop per eye daily as needed.  Eye lubricant drops (i.e., Natural Tears) if needed.   Diagnostics: Spirometry:  Normal with an FEV1 of 119% predicted. This study was performed while the patient was asymptomatic.  Please see scanned spirometry results for details.    Physical examination: Blood pressure 126/74, pulse 95, temperature (!) 97.5 F (36.4 C), temperature source Temporal, resp. rate 16, height 5\' 2"  (1.575 m), weight 266 lb (120.7 kg), SpO2 97 %.  General: Alert, interactive, in no acute distress. HEENT: TMs pearly gray, turbinates minimally edematous without discharge, post-pharynx unremarkable. Neck: Supple without lymphadenopathy. Lungs: Clear to auscultation without wheezing, rhonchi or rales. CV: Normal S1, S2 without murmurs. Skin: Warm and dry, without lesions or rashes.  The following portions of the patient's history were reviewed and updated as appropriate: allergies, current medications, past family history, past medical history, past social history, past surgical history and problem list.  Current Outpatient Medications  Medication Sig Dispense Refill  . albuterol (VENTOLIN HFA) 108 (90 Base) MCG/ACT inhaler TAKE 2 PUFFS BY MOUTH EVERY 6 HOURS AS NEEDED FOR WHEEZE OR SHORTNESS OF BREATH 18 g 1  . Azelastine HCl 0.15 % SOLN Place 1-2 sprays into both nostrils 2 (two) times daily as needed. 30 mL 5  . Carbinoxamine Maleate (RYVENT) 6 MG TABS Take 1 tablet by mouth every 6 (six) hours as needed. 120 tablet 5  . Carbinoxamine Maleate 4 MG TABS Take 1 tablet (4 mg total) by mouth 2 (two) times daily as needed (allergies). 180 tablet 3  . EPINEPHrine 0.3 mg/0.3  mL IJ SOAJ injection INJECT 0.3 MLS (0.3 MG TOTAL) INTO THE MUSCLE  ONCE FOR 1 DOSE.    . famotidine (PEPCID) 20 MG tablet Take 1 tablet (20 mg total) by mouth 2 (two) times daily. 60 tablet 5  . Meth-Hyo-M Bl-Na Phos-Ph Sal (URO-MP) 118 MG CAPS Take 1 capsule by mouth 4 (four) times daily as needed.    . montelukast (SINGULAIR) 10 MG tablet TAKE 1 TABLET BY MOUTH EVERYDAY AT BEDTIME 90 tablet 2  . SYMBICORT 160-4.5 MCG/ACT inhaler TAKE 2 PUFFS BY MOUTH TWICE A DAY 30 Inhaler 2  . Olopatadine HCl (PAZEO) 0.7 % SOLN Place 1 drop into both eyes 1 day or 1 dose. 2.5 mL 3   No current facility-administered medications for this visit.     Allergies  Allergen Reactions  . Sulfamethoxazole Nausea Only    Dizziness  . Doxycycline   . Tetracyclines & Related    Review of systems: Review of systems negative except as noted in HPI / PMHx or noted below: Constitutional: Negative.  HENT: Negative.   Eyes: Negative.  Respiratory: Negative.   Cardiovascular: Negative.  Gastrointestinal: Negative.  Genitourinary: Negative.  Musculoskeletal: Negative.  Neurological: Negative.  Endo/Heme/Allergies: Negative.  Cutaneous: Negative.  Past Medical History:  Diagnosis Date  . Diverticulosis   . GERD (gastroesophageal reflux disease)   . Moderate persistent asthma 01/08/2019    Family History  Problem Relation Age of Onset  . Hypertension Father   . Allergic rhinitis Father   . Diabetes Paternal Uncle   . Heart disease Paternal Aunt     Social History   Socioeconomic History  . Marital status: Married    Spouse name: Not on file  . Number of children: Not on file  . Years of education: Not on file  . Highest education level: Not on file  Occupational History  . Not on file  Social Needs  . Financial resource strain: Not on file  . Food insecurity    Worry: Not on file    Inability: Not on file  . Transportation needs    Medical: Not on file    Non-medical: Not on file  Tobacco Use  . Smoking status: Never Smoker  . Smokeless tobacco: Never  Used  Substance and Sexual Activity  . Alcohol use: Yes    Comment: once a week  . Drug use: No  . Sexual activity: Yes  Lifestyle  . Physical activity    Days per week: Not on file    Minutes per session: Not on file  . Stress: Not on file  Relationships  . Social Herbalist on phone: Not on file    Gets together: Not on file    Attends religious service: Not on file    Active member of club or organization: Not on file    Attends meetings of clubs or organizations: Not on file    Relationship status: Not on file  . Intimate partner violence    Fear of current or ex partner: Not on file    Emotionally abused: Not on file    Physically abused: Not on file    Forced sexual activity: Not on file  Other Topics Concern  . Not on file  Social History Narrative  . Not on file    I appreciate the opportunity to take part in E[place] Sue's care. Please do not hesitate to contact me with questions.  Sincerely,   R. Edgar Frisk, MD

## 2019-06-25 NOTE — Assessment & Plan Note (Addendum)
   Treatment plan as outlined above for allergic rhinitis.  A prescription has been provided for Pazeo, one drop per eye daily as needed.  Eye lubricant drops (i.e., Natural Tears) if needed.

## 2019-06-29 ENCOUNTER — Ambulatory Visit (INDEPENDENT_AMBULATORY_CARE_PROVIDER_SITE_OTHER): Payer: BC Managed Care – PPO

## 2019-06-29 DIAGNOSIS — J309 Allergic rhinitis, unspecified: Secondary | ICD-10-CM

## 2019-07-04 ENCOUNTER — Ambulatory Visit (INDEPENDENT_AMBULATORY_CARE_PROVIDER_SITE_OTHER): Payer: BC Managed Care – PPO | Admitting: *Deleted

## 2019-07-04 DIAGNOSIS — J309 Allergic rhinitis, unspecified: Secondary | ICD-10-CM | POA: Diagnosis not present

## 2019-07-10 ENCOUNTER — Ambulatory Visit (INDEPENDENT_AMBULATORY_CARE_PROVIDER_SITE_OTHER): Payer: BC Managed Care – PPO | Admitting: *Deleted

## 2019-07-10 DIAGNOSIS — J309 Allergic rhinitis, unspecified: Secondary | ICD-10-CM

## 2019-07-16 ENCOUNTER — Ambulatory Visit (INDEPENDENT_AMBULATORY_CARE_PROVIDER_SITE_OTHER): Payer: BC Managed Care – PPO

## 2019-07-16 DIAGNOSIS — J309 Allergic rhinitis, unspecified: Secondary | ICD-10-CM

## 2019-07-28 ENCOUNTER — Other Ambulatory Visit: Payer: Self-pay | Admitting: Allergy and Immunology

## 2019-08-06 ENCOUNTER — Ambulatory Visit (INDEPENDENT_AMBULATORY_CARE_PROVIDER_SITE_OTHER): Payer: BC Managed Care – PPO

## 2019-08-06 DIAGNOSIS — J309 Allergic rhinitis, unspecified: Secondary | ICD-10-CM

## 2019-08-15 ENCOUNTER — Ambulatory Visit (INDEPENDENT_AMBULATORY_CARE_PROVIDER_SITE_OTHER): Payer: BC Managed Care – PPO | Admitting: *Deleted

## 2019-08-15 DIAGNOSIS — J309 Allergic rhinitis, unspecified: Secondary | ICD-10-CM

## 2019-08-23 ENCOUNTER — Ambulatory Visit (INDEPENDENT_AMBULATORY_CARE_PROVIDER_SITE_OTHER): Payer: BC Managed Care – PPO

## 2019-08-23 DIAGNOSIS — J309 Allergic rhinitis, unspecified: Secondary | ICD-10-CM

## 2019-08-31 ENCOUNTER — Ambulatory Visit (INDEPENDENT_AMBULATORY_CARE_PROVIDER_SITE_OTHER): Payer: BC Managed Care – PPO | Admitting: *Deleted

## 2019-08-31 DIAGNOSIS — J309 Allergic rhinitis, unspecified: Secondary | ICD-10-CM

## 2019-09-06 ENCOUNTER — Ambulatory Visit (INDEPENDENT_AMBULATORY_CARE_PROVIDER_SITE_OTHER): Payer: BC Managed Care – PPO

## 2019-09-06 DIAGNOSIS — J309 Allergic rhinitis, unspecified: Secondary | ICD-10-CM | POA: Diagnosis not present

## 2019-09-07 ENCOUNTER — Other Ambulatory Visit: Payer: Self-pay | Admitting: Allergy and Immunology

## 2019-09-11 ENCOUNTER — Ambulatory Visit (INDEPENDENT_AMBULATORY_CARE_PROVIDER_SITE_OTHER): Payer: BC Managed Care – PPO

## 2019-09-11 DIAGNOSIS — J309 Allergic rhinitis, unspecified: Secondary | ICD-10-CM | POA: Diagnosis not present

## 2019-09-14 ENCOUNTER — Ambulatory Visit (INDEPENDENT_AMBULATORY_CARE_PROVIDER_SITE_OTHER): Payer: BC Managed Care – PPO | Admitting: *Deleted

## 2019-09-14 DIAGNOSIS — J309 Allergic rhinitis, unspecified: Secondary | ICD-10-CM | POA: Diagnosis not present

## 2019-09-18 ENCOUNTER — Ambulatory Visit (INDEPENDENT_AMBULATORY_CARE_PROVIDER_SITE_OTHER): Payer: BC Managed Care – PPO

## 2019-09-18 DIAGNOSIS — J309 Allergic rhinitis, unspecified: Secondary | ICD-10-CM

## 2019-09-25 ENCOUNTER — Ambulatory Visit (INDEPENDENT_AMBULATORY_CARE_PROVIDER_SITE_OTHER): Payer: BC Managed Care – PPO

## 2019-09-25 DIAGNOSIS — J309 Allergic rhinitis, unspecified: Secondary | ICD-10-CM | POA: Diagnosis not present

## 2019-09-28 ENCOUNTER — Ambulatory Visit (INDEPENDENT_AMBULATORY_CARE_PROVIDER_SITE_OTHER): Payer: BC Managed Care – PPO | Admitting: *Deleted

## 2019-09-28 DIAGNOSIS — J309 Allergic rhinitis, unspecified: Secondary | ICD-10-CM

## 2019-10-02 ENCOUNTER — Ambulatory Visit (INDEPENDENT_AMBULATORY_CARE_PROVIDER_SITE_OTHER): Payer: BC Managed Care – PPO

## 2019-10-02 DIAGNOSIS — J309 Allergic rhinitis, unspecified: Secondary | ICD-10-CM

## 2019-10-09 ENCOUNTER — Ambulatory Visit (INDEPENDENT_AMBULATORY_CARE_PROVIDER_SITE_OTHER): Payer: BC Managed Care – PPO | Admitting: *Deleted

## 2019-10-09 DIAGNOSIS — J309 Allergic rhinitis, unspecified: Secondary | ICD-10-CM

## 2019-10-16 ENCOUNTER — Ambulatory Visit (INDEPENDENT_AMBULATORY_CARE_PROVIDER_SITE_OTHER): Payer: BC Managed Care – PPO

## 2019-10-16 DIAGNOSIS — J309 Allergic rhinitis, unspecified: Secondary | ICD-10-CM

## 2019-10-19 ENCOUNTER — Ambulatory Visit (INDEPENDENT_AMBULATORY_CARE_PROVIDER_SITE_OTHER): Payer: BC Managed Care – PPO

## 2019-10-19 DIAGNOSIS — J309 Allergic rhinitis, unspecified: Secondary | ICD-10-CM

## 2019-10-22 ENCOUNTER — Ambulatory Visit (INDEPENDENT_AMBULATORY_CARE_PROVIDER_SITE_OTHER): Payer: BC Managed Care – PPO

## 2019-10-22 DIAGNOSIS — J309 Allergic rhinitis, unspecified: Secondary | ICD-10-CM | POA: Diagnosis not present

## 2019-10-29 ENCOUNTER — Ambulatory Visit: Payer: BC Managed Care – PPO | Admitting: Allergy and Immunology

## 2019-10-31 ENCOUNTER — Ambulatory Visit (INDEPENDENT_AMBULATORY_CARE_PROVIDER_SITE_OTHER): Payer: BC Managed Care – PPO | Admitting: Allergy and Immunology

## 2019-10-31 ENCOUNTER — Encounter: Payer: Self-pay | Admitting: Allergy and Immunology

## 2019-10-31 ENCOUNTER — Other Ambulatory Visit: Payer: Self-pay

## 2019-10-31 VITALS — BP 128/82 | HR 86 | Temp 97.0°F | Resp 16

## 2019-10-31 DIAGNOSIS — J3089 Other allergic rhinitis: Secondary | ICD-10-CM

## 2019-10-31 DIAGNOSIS — J4530 Mild persistent asthma, uncomplicated: Secondary | ICD-10-CM

## 2019-10-31 DIAGNOSIS — K219 Gastro-esophageal reflux disease without esophagitis: Secondary | ICD-10-CM | POA: Diagnosis not present

## 2019-10-31 DIAGNOSIS — R05 Cough: Secondary | ICD-10-CM | POA: Diagnosis not present

## 2019-10-31 DIAGNOSIS — R053 Chronic cough: Secondary | ICD-10-CM

## 2019-10-31 NOTE — Assessment & Plan Note (Addendum)
   Continue appropriate allergen avoidance measures, immunotherapy injections per protocol, azelastine nasal spray as needed, and RyVent (carbinoxamine) if needed.  Nasal saline spray (i.e., Simply Saline) or nasal saline lavage (i.e., NeilMed) is recommended as needed and prior to medicated nasal sprays.  Medications will be decreased as symptom relief from immunotherapy becomes evident.

## 2019-10-31 NOTE — Assessment & Plan Note (Signed)
Improved overall.  Treatment plan as outlined above.

## 2019-10-31 NOTE — Patient Instructions (Addendum)
Mild persistent asthma Well-controlled.  Continue albuterol HFA, 1 to 2 inhalations every 4-6 hours if needed.  During respiratory tract infections and asthma flares, add Symbicort 160-4.5 g, 2 inhalations via spacer device twice daily until symptoms have returned to baseline.  Subjective and objective measures of pulmonary function will be followed and the treatment plan will be adjusted accordingly.  Seasonal and perennial allergic rhinitis  Continue appropriate allergen avoidance measures, immunotherapy injections per protocol, azelastine nasal spray as needed, and RyVent (carbinoxamine) if needed.  Nasal saline spray (i.e., Simply Saline) or nasal saline lavage (i.e., NeilMed) is recommended as needed and prior to medicated nasal sprays.  Medications will be decreased as symptom relief from immunotherapy becomes evident.  Cough, persistent Improved overall.  Treatment plan as outlined above.  Acid reflux  Continue appropriate reflux lifestyle modifications.  If needed, add famotidine (Pepcid) 20 mg 1-2 times daily.   Return in about 6 months (around 05/02/2020), or if symptoms worsen or fail to improve.

## 2019-10-31 NOTE — Assessment & Plan Note (Signed)
   Continue appropriate reflux lifestyle modifications.  If needed, add famotidine (Pepcid) 20 mg 1-2 times daily. 

## 2019-10-31 NOTE — Progress Notes (Signed)
Follow-up Note  RE: Stacy Tran MRN: [id number] DOB: 10/01/1970 Date of Office Visit: 10/31/2019  Primary care provider: Patient, No Pcp Per Referring provider: No ref. provider found  History of present illness: Stacy Tran is a 49 y.o. female with persistent asthma, allergic rhinoconjunctivitis, and history of persistent cough presenting today for follow-up.  She was last seen in this clinic in November 2020.  She reports that in interval since her previous visit her asthma has been well controlled.  She denies limitations in normal daily activities or nocturnal awakenings due to lower respiratory symptoms.  She reports that she has started to experiencing postnasal drainage and rare cough over the past few weeks with spring pollen exposure, therefore she started using azelastine once daily over the past couple weeks.  She denies heartburn.  Assessment and plan: Mild persistent asthma Well-controlled.  Continue albuterol HFA, 1 to 2 inhalations every 4-6 hours if needed.  During respiratory tract infections and asthma flares, add Symbicort 160-4.5 g, 2 inhalations via spacer device twice daily until symptoms have returned to baseline.  Subjective and objective measures of pulmonary function will be followed and the treatment plan will be adjusted accordingly.  Seasonal and perennial allergic rhinitis  Continue appropriate allergen avoidance measures, immunotherapy injections per protocol, azelastine nasal spray as needed, and RyVent (carbinoxamine) if needed.  Nasal saline spray (i.e., Simply Saline) or nasal saline lavage (i.e., NeilMed) is recommended as needed and prior to medicated nasal sprays.  Medications will be decreased as symptom relief from immunotherapy becomes evident.  Cough, persistent Improved overall.  Treatment plan as outlined above.  Acid reflux  Continue appropriate reflux lifestyle modifications.  If needed, add famotidine (Pepcid) 20 mg  1-2 times daily.   Diagnostics: Spirometry:  Normal with an FEV1 of 109% predicted. This study was performed while the patient was asymptomatic.  Please see scanned spirometry results for details.    Physical examination: Blood pressure 128/82, pulse 86, temperature (!) 97 F (36.1 C), temperature source Temporal, resp. rate 16, SpO2 95 %.  General: Alert, interactive, in no acute distress. HEENT: TMs pearly gray, turbinates mildly edematous without discharge, post-pharynx mildly erythematous. Neck: Supple without lymphadenopathy. Lungs: Clear to auscultation without wheezing, rhonchi or rales. CV: Normal S1, S2 without murmurs. Skin: Warm and dry, without lesions or rashes.  The following portions of the patient's history were reviewed and updated as appropriate: allergies, current medications, past family history, past medical history, past social history, past surgical history and problem list.  Current Outpatient Medications  Medication Sig Dispense Refill  . albuterol (VENTOLIN HFA) 108 (90 Base) MCG/ACT inhaler TAKE 2 PUFFS BY MOUTH EVERY 6 HOURS AS NEEDED FOR WHEEZE OR SHORTNESS OF BREATH 18 g 1  . Azelastine HCl 0.15 % SOLN Place 1-2 sprays into both nostrils 2 (two) times daily as needed. 30 mL 5  . Carbinoxamine Maleate (RYVENT) 6 MG TABS Take 1 tablet by mouth every 6 (six) hours as needed. 120 tablet 5  . Carbinoxamine Maleate 4 MG TABS Take 1 tablet (4 mg total) by mouth 2 (two) times daily as needed (allergies). 180 tablet 3  . EPINEPHrine 0.3 mg/0.3 mL IJ SOAJ injection INJECT 0.3 MLS (0.3 MG TOTAL) INTO THE MUSCLE ONCE FOR 1 DOSE.    Marland Kitchen Meth-Hyo-M Bl-Na Phos-Ph Sal (URO-MP) 118 MG CAPS Take 1 capsule by mouth 4 (four) times daily as needed.    . montelukast (SINGULAIR) 10 MG tablet TAKE 1 TABLET BY MOUTH EVERYDAY AT  BEDTIME 90 tablet 0  . NON FORMULARY ALLERGY INJECTIONS    . Olopatadine HCl (PAZEO) 0.7 % SOLN Place 1 drop into both eyes 1 day or 1 dose. 2.5 mL 3  .  famotidine (PEPCID) 20 MG tablet Take 1 tablet (20 mg total) by mouth 2 (two) times daily. (Patient not taking: Reported on 10/31/2019) 60 tablet 5  . SYMBICORT 160-4.5 MCG/ACT inhaler TAKE 2 PUFFS BY MOUTH TWICE A DAY (Patient not taking: Reported on 10/31/2019) 30 Inhaler 2   No current facility-administered medications for this visit.    Allergies  Allergen Reactions  . Sulfamethoxazole Nausea Only    Dizziness  . Doxycycline   . Tetracyclines & Related     I appreciate the opportunity to take part in [place] Sue's care. Please do not hesitate to contact me with questions.  Sincerely,   R. Edgar Frisk, MD

## 2019-10-31 NOTE — Assessment & Plan Note (Signed)
Well-controlled.  Continue albuterol HFA, 1 to 2 inhalations every 4-6 hours if needed.  During respiratory tract infections and asthma flares, add Symbicort 160-4.5 g, 2 inhalations via spacer device twice daily until symptoms have returned to baseline.  Subjective and objective measures of pulmonary function will be followed and the treatment plan will be adjusted accordingly.

## 2019-11-02 ENCOUNTER — Ambulatory Visit (INDEPENDENT_AMBULATORY_CARE_PROVIDER_SITE_OTHER): Payer: BC Managed Care – PPO | Admitting: *Deleted

## 2019-11-02 DIAGNOSIS — J309 Allergic rhinitis, unspecified: Secondary | ICD-10-CM | POA: Diagnosis not present

## 2019-11-07 ENCOUNTER — Ambulatory Visit (INDEPENDENT_AMBULATORY_CARE_PROVIDER_SITE_OTHER): Payer: BC Managed Care – PPO

## 2019-11-07 DIAGNOSIS — J309 Allergic rhinitis, unspecified: Secondary | ICD-10-CM | POA: Diagnosis not present

## 2019-11-21 ENCOUNTER — Ambulatory Visit (INDEPENDENT_AMBULATORY_CARE_PROVIDER_SITE_OTHER): Payer: BC Managed Care – PPO

## 2019-11-21 DIAGNOSIS — J309 Allergic rhinitis, unspecified: Secondary | ICD-10-CM

## 2019-11-26 ENCOUNTER — Ambulatory Visit (INDEPENDENT_AMBULATORY_CARE_PROVIDER_SITE_OTHER): Payer: BC Managed Care – PPO

## 2019-11-26 DIAGNOSIS — J309 Allergic rhinitis, unspecified: Secondary | ICD-10-CM

## 2019-11-29 ENCOUNTER — Ambulatory Visit (INDEPENDENT_AMBULATORY_CARE_PROVIDER_SITE_OTHER): Payer: BC Managed Care – PPO

## 2019-11-29 DIAGNOSIS — J309 Allergic rhinitis, unspecified: Secondary | ICD-10-CM

## 2019-12-04 ENCOUNTER — Ambulatory Visit (INDEPENDENT_AMBULATORY_CARE_PROVIDER_SITE_OTHER): Payer: BC Managed Care – PPO

## 2019-12-04 DIAGNOSIS — J309 Allergic rhinitis, unspecified: Secondary | ICD-10-CM

## 2019-12-10 ENCOUNTER — Ambulatory Visit (INDEPENDENT_AMBULATORY_CARE_PROVIDER_SITE_OTHER): Payer: BC Managed Care – PPO

## 2019-12-10 DIAGNOSIS — J309 Allergic rhinitis, unspecified: Secondary | ICD-10-CM

## 2019-12-12 ENCOUNTER — Other Ambulatory Visit: Payer: Self-pay | Admitting: Allergy and Immunology

## 2019-12-17 DIAGNOSIS — J3089 Other allergic rhinitis: Secondary | ICD-10-CM

## 2019-12-17 NOTE — Progress Notes (Signed)
VIALS EXP 12-16-20 

## 2019-12-21 ENCOUNTER — Ambulatory Visit (INDEPENDENT_AMBULATORY_CARE_PROVIDER_SITE_OTHER): Payer: BC Managed Care – PPO

## 2019-12-21 DIAGNOSIS — J309 Allergic rhinitis, unspecified: Secondary | ICD-10-CM | POA: Diagnosis not present

## 2019-12-26 ENCOUNTER — Ambulatory Visit (INDEPENDENT_AMBULATORY_CARE_PROVIDER_SITE_OTHER): Payer: BC Managed Care – PPO | Admitting: *Deleted

## 2019-12-26 DIAGNOSIS — J309 Allergic rhinitis, unspecified: Secondary | ICD-10-CM

## 2020-01-08 ENCOUNTER — Ambulatory Visit (INDEPENDENT_AMBULATORY_CARE_PROVIDER_SITE_OTHER): Payer: BC Managed Care – PPO

## 2020-01-08 DIAGNOSIS — J309 Allergic rhinitis, unspecified: Secondary | ICD-10-CM | POA: Diagnosis not present

## 2020-01-15 ENCOUNTER — Telehealth: Payer: Self-pay

## 2020-01-15 NOTE — Telephone Encounter (Signed)
Stacy Blanks, MD 11 hours ago (10:06 PM)   I seem to be stuck in an endless cycle of Green vial Immunotherapy injections that I can't get out of due to the High Grass pollen.  I go up a tiny amount and my injection site swells up, so it gets backed down, and then no swelling.  But then it gets  increased and I swell again.  I've been stuck in this cycle for months and I'm now about to start on my 3rd green vial (which is expensive) and so I'm wondering . . [place] pollen is bad until Sept (at least that was the case last year).  I've already moved from twice a week down to once a week . . . would it make sense to get my shots once every 14 days (I know I can't go over 15 days without having to repeat the last dosage), until the Grass pollen settles down, then resume and see if I can push past this stage?  Thanks! Stacy Tran  Green vial 0.35 g-t-dm and m-c-d last injection on 01-08-2020

## 2020-01-15 NOTE — Telephone Encounter (Signed)
Yes, we can freeze the dose of the green vial with injections every 2 weeks until the grass pollen season has ended in the fall. Thanks.

## 2020-01-16 NOTE — Telephone Encounter (Signed)
NOTED this in pts flowsheet, tried calling pt but kept getting hung up on.

## 2020-01-22 ENCOUNTER — Ambulatory Visit (INDEPENDENT_AMBULATORY_CARE_PROVIDER_SITE_OTHER): Payer: BC Managed Care – PPO

## 2020-01-22 DIAGNOSIS — J309 Allergic rhinitis, unspecified: Secondary | ICD-10-CM | POA: Diagnosis not present

## 2020-02-05 ENCOUNTER — Ambulatory Visit (INDEPENDENT_AMBULATORY_CARE_PROVIDER_SITE_OTHER): Payer: BC Managed Care – PPO

## 2020-02-05 DIAGNOSIS — J309 Allergic rhinitis, unspecified: Secondary | ICD-10-CM

## 2020-02-12 ENCOUNTER — Other Ambulatory Visit: Payer: Self-pay

## 2020-02-12 MED ORDER — CARBINOXAMINE MALEATE 6 MG PO TABS: 1.0000 | ORAL_TABLET | Freq: Four times a day (QID) | ORAL | 2 refills | Status: DC | PRN

## 2020-02-18 ENCOUNTER — Ambulatory Visit (INDEPENDENT_AMBULATORY_CARE_PROVIDER_SITE_OTHER): Payer: BC Managed Care – PPO

## 2020-02-18 DIAGNOSIS — J309 Allergic rhinitis, unspecified: Secondary | ICD-10-CM | POA: Diagnosis not present

## 2020-03-04 ENCOUNTER — Ambulatory Visit (INDEPENDENT_AMBULATORY_CARE_PROVIDER_SITE_OTHER): Payer: BC Managed Care – PPO

## 2020-03-04 DIAGNOSIS — J309 Allergic rhinitis, unspecified: Secondary | ICD-10-CM

## 2020-03-17 ENCOUNTER — Ambulatory Visit (INDEPENDENT_AMBULATORY_CARE_PROVIDER_SITE_OTHER): Payer: BC Managed Care – PPO

## 2020-03-17 DIAGNOSIS — J309 Allergic rhinitis, unspecified: Secondary | ICD-10-CM

## 2020-03-31 ENCOUNTER — Ambulatory Visit (INDEPENDENT_AMBULATORY_CARE_PROVIDER_SITE_OTHER): Payer: BC Managed Care – PPO

## 2020-03-31 DIAGNOSIS — J309 Allergic rhinitis, unspecified: Secondary | ICD-10-CM

## 2020-04-10 ENCOUNTER — Ambulatory Visit (INDEPENDENT_AMBULATORY_CARE_PROVIDER_SITE_OTHER): Payer: BC Managed Care – PPO

## 2020-04-10 DIAGNOSIS — J309 Allergic rhinitis, unspecified: Secondary | ICD-10-CM

## 2020-04-24 ENCOUNTER — Ambulatory Visit (INDEPENDENT_AMBULATORY_CARE_PROVIDER_SITE_OTHER): Payer: BC Managed Care – PPO | Admitting: *Deleted

## 2020-04-24 DIAGNOSIS — J309 Allergic rhinitis, unspecified: Secondary | ICD-10-CM

## 2020-05-05 ENCOUNTER — Ambulatory Visit (INDEPENDENT_AMBULATORY_CARE_PROVIDER_SITE_OTHER): Payer: BC Managed Care – PPO

## 2020-05-05 DIAGNOSIS — J309 Allergic rhinitis, unspecified: Secondary | ICD-10-CM

## 2020-05-08 ENCOUNTER — Other Ambulatory Visit: Payer: Self-pay

## 2020-05-08 ENCOUNTER — Ambulatory Visit (INDEPENDENT_AMBULATORY_CARE_PROVIDER_SITE_OTHER): Payer: BC Managed Care – PPO | Admitting: Allergy and Immunology

## 2020-05-08 ENCOUNTER — Encounter: Payer: Self-pay | Admitting: Allergy and Immunology

## 2020-05-08 VITALS — BP 120/76 | HR 74 | Temp 98.2°F | Resp 20 | Ht 62.0 in | Wt 255.6 lb

## 2020-05-08 DIAGNOSIS — H1013 Acute atopic conjunctivitis, bilateral: Secondary | ICD-10-CM | POA: Diagnosis not present

## 2020-05-08 DIAGNOSIS — J4530 Mild persistent asthma, uncomplicated: Secondary | ICD-10-CM | POA: Diagnosis not present

## 2020-05-08 DIAGNOSIS — J3089 Other allergic rhinitis: Secondary | ICD-10-CM | POA: Diagnosis not present

## 2020-05-08 DIAGNOSIS — K219 Gastro-esophageal reflux disease without esophagitis: Secondary | ICD-10-CM

## 2020-05-08 NOTE — Assessment & Plan Note (Signed)
   Continue appropriate reflux lifestyle modifications.  If needed, add famotidine (Pepcid) 20 mg 1-2 times daily.

## 2020-05-08 NOTE — Progress Notes (Signed)
Follow-up Note  RE: Stacy Tran MRN: [id number] DOB: [birth year] Date of Office Visit: 05/08/2020  Primary care provider: Patient, No Pcp Per Referring provider: No ref. provider found  History of present illness: Stacy Tran is a 49 y.o. female with persistent asthma, allergic rhinoconjunctivitis, acid reflux, and history of persistent cough presenting today for follow-up.  She was last seen in this clinic on October 31, 2019.  She reports that she had increased albuterol quire meant during July and August.  She is receiving immunotherapy injections per protocol but her progress has been hindered by local reactions, requiring doses to be dropped back or repeated.  She does report significant improvement in her nasal and ocular allergy symptoms when comparing this spring to the previous spring.  She currently takes RyVent every night and azelastine nasal spray when needed.  Her reflux is stable/well-controlled by using famotidine as needed.  Assessment and plan: Mild persistent asthma  Start montelukast 10 mg daily at bedtime.  Continue albuterol HFA, 1 to 2 inhalations every 4-6 hours if needed.  During respiratory tract infections and asthma flares, add Symbicort 160-4.5 g, 2 inhalations via spacer device twice daily until symptoms have returned to baseline.  Subjective and objective measures of pulmonary function will be followed and the treatment plan will be adjusted accordingly.  Seasonal and perennial allergic rhinitis  Continue appropriate allergen avoidance measures.  Continue immunotherapy injections per protocol.  Continue azelastine nasal spray as needed.  Continue RyVent (carbinoxamine) daily as needed.  Nasal saline spray (i.e., Simply Saline) or nasal saline lavage (i.e., NeilMed) is recommended as needed and prior to medicated nasal sprays.  Acid reflux  Continue appropriate reflux lifestyle modifications.  If needed, add famotidine (Pepcid) 20 mg 1-2  times daily.   Diagnostics: Spirometry:  Normal with an FEV1 of 119% predicted. This study was performed while the patient was asymptomatic.  Please see scanned spirometry results for details.    Physical examination: Blood pressure 120/76, pulse 74, temperature 98.2 F (36.8 C), temperature source Tympanic, resp. rate 20, height 5\' 2"  (1.575 m), weight 255 lb 9.6 oz (115.9 kg), SpO2 96 %.  General: Alert, interactive, in no acute distress. HEENT: TMs pearly gray, turbinates mildly edematous without discharge, post-pharynx unremarkable. Neck: Supple without lymphadenopathy. Lungs: Clear to auscultation without wheezing, rhonchi or rales. CV: Normal S1, S2 without murmurs. Skin: Warm and dry, without lesions or rashes.  The following portions of the patient's history were reviewed and updated as appropriate: allergies, current medications, past family history, past medical history, past social history, past surgical history and problem list.  Current Outpatient Medications  Medication Sig Dispense Refill  . albuterol (VENTOLIN HFA) 108 (90 Base) MCG/ACT inhaler TAKE 2 PUFFS BY MOUTH EVERY 6 HOURS AS NEEDED FOR WHEEZE OR SHORTNESS OF BREATH 18 g 1  . Azelastine HCl 0.15 % SOLN Place 1-2 sprays into both nostrils 2 (two) times daily as needed. 30 mL 5  . Carbinoxamine Maleate (RYVENT) 6 MG TABS Take 1 tablet by mouth every 6 (six) hours as needed. 120 tablet 2  . Carbinoxamine Maleate 4 MG TABS Take 1 tablet (4 mg total) by mouth 2 (two) times daily as needed (allergies). 180 tablet 3  . EPINEPHrine 0.3 mg/0.3 mL IJ SOAJ injection INJECT 0.3 MLS (0.3 MG TOTAL) INTO THE MUSCLE ONCE FOR 1 DOSE.    . famotidine (PEPCID) 20 MG tablet Take 1 tablet (20 mg total) by mouth 2 (two) times daily. 60 tablet 5  .  Meth-Hyo-M Bl-Na Phos-Ph Sal (URO-MP) 118 MG CAPS Take 1 capsule by mouth 4 (four) times daily as needed.    . montelukast (SINGULAIR) 10 MG tablet TAKE 1 TABLET BY MOUTH EVERYDAY AT BEDTIME  90 tablet 0  . NON FORMULARY ALLERGY INJECTIONS    . Olopatadine HCl (PAZEO) 0.7 % SOLN Place 1 drop into both eyes 1 day or 1 dose. 2.5 mL 3  . SYMBICORT 160-4.5 MCG/ACT inhaler TAKE 2 PUFFS BY MOUTH TWICE A DAY (Patient taking differently: Inhale 2 puffs into the lungs as needed. ) 30 Inhaler 2   No current facility-administered medications for this visit.    Allergies  Allergen Reactions  . Sulfamethoxazole Nausea Only    Dizziness  . Doxycycline   . Tetracyclines & Related    Review of systems: Review of systems negative except as noted in HPI / PMHx.  Past Medical History:  Diagnosis Date  . Diverticulosis   . GERD (gastroesophageal reflux disease)   . Moderate persistent asthma 01/08/2019    Family History  Problem Relation Age of Onset  . Hypertension Father   . Allergic rhinitis Father   . Diabetes Paternal Uncle   . Heart disease Paternal Aunt     Social History   Socioeconomic History  . Marital status: Married    Spouse name: Not on file  . Number of children: Not on file  . Years of education: Not on file  . Highest education level: Not on file  Occupational History  . Not on file  Tobacco Use  . Smoking status: Never Smoker  . Smokeless tobacco: Never Used  Vaping Use  . Vaping Use: Never used  Substance and Sexual Activity  . Alcohol use: Yes    Comment: once a week  . Drug use: No  . Sexual activity: Yes  Other Topics Concern  . Not on file  Social History Narrative  . Not on file   Social Determinants of Health   Financial Resource Strain:   . Difficulty of Paying Living Expenses: Not on file  Food Insecurity:   . Worried About Programme researcher, broadcasting/film/video in the Last Year: Not on file  . Ran Out of Food in the Last Year: Not on file  Transportation Needs:   . Lack of Transportation (Medical): Not on file  . Lack of Transportation (Non-Medical): Not on file  Physical Activity:   . Days of Exercise per Week: Not on file  . Minutes of Exercise  per Session: Not on file  Stress:   . Feeling of Stress : Not on file  Social Connections:   . Frequency of Communication with Friends and Family: Not on file  . Frequency of Social Gatherings with Friends and Family: Not on file  . Attends Religious Services: Not on file  . Active Member of Clubs or Organizations: Not on file  . Attends Banker Meetings: Not on file  . Marital Status: Not on file  Intimate Partner Violence:   . Fear of Current or Ex-Partner: Not on file  . Emotionally Abused: Not on file  . Physically Abused: Not on file  . Sexually Abused: Not on file    I appreciate the opportunity to take part in [place] Sue's care. Please do not hesitate to contact me with questions.  Sincerely,   R. Jorene Guest, MD

## 2020-05-08 NOTE — Assessment & Plan Note (Signed)
   Continue appropriate allergen avoidance measures.  Continue immunotherapy injections per protocol.  Continue azelastine nasal spray as needed.  Continue RyVent (carbinoxamine) daily as needed.  Nasal saline spray (i.e., Simply Saline) or nasal saline lavage (i.e., NeilMed) is recommended as needed and prior to medicated nasal sprays.

## 2020-05-08 NOTE — Patient Instructions (Addendum)
Mild persistent asthma  Start montelukast 10 mg daily at bedtime.  Continue albuterol HFA, 1 to 2 inhalations every 4-6 hours if needed.  During respiratory tract infections and asthma flares, add Symbicort 160-4.5 g, 2 inhalations via spacer device twice daily until symptoms have returned to baseline.  Subjective and objective measures of pulmonary function will be followed and the treatment plan will be adjusted accordingly.  Seasonal and perennial allergic rhinitis  Continue appropriate allergen avoidance measures.  Continue immunotherapy injections per protocol.  Continue azelastine nasal spray as needed.  Continue RyVent (carbinoxamine) daily as needed.  Nasal saline spray (i.e., Simply Saline) or nasal saline lavage (i.e., NeilMed) is recommended as needed and prior to medicated nasal sprays.  Acid reflux  Continue appropriate reflux lifestyle modifications.  If needed, add famotidine (Pepcid) 20 mg 1-2 times daily.   Return in about 5 months (around 10/06/2020), or if symptoms worsen or fail to improve.

## 2020-05-08 NOTE — Assessment & Plan Note (Signed)
   Start montelukast 10 mg daily at bedtime.  Continue albuterol HFA, 1 to 2 inhalations every 4-6 hours if needed.  During respiratory tract infections and asthma flares, add Symbicort 160-4.5 g, 2 inhalations via spacer device twice daily until symptoms have returned to baseline.  Subjective and objective measures of pulmonary function will be followed and the treatment plan will be adjusted accordingly.

## 2020-05-14 ENCOUNTER — Ambulatory Visit (INDEPENDENT_AMBULATORY_CARE_PROVIDER_SITE_OTHER): Payer: BC Managed Care – PPO | Admitting: *Deleted

## 2020-05-14 DIAGNOSIS — J309 Allergic rhinitis, unspecified: Secondary | ICD-10-CM | POA: Diagnosis not present

## 2020-05-20 ENCOUNTER — Ambulatory Visit (INDEPENDENT_AMBULATORY_CARE_PROVIDER_SITE_OTHER): Payer: BC Managed Care – PPO

## 2020-05-20 DIAGNOSIS — J309 Allergic rhinitis, unspecified: Secondary | ICD-10-CM | POA: Diagnosis not present

## 2020-05-29 ENCOUNTER — Ambulatory Visit (INDEPENDENT_AMBULATORY_CARE_PROVIDER_SITE_OTHER): Payer: BC Managed Care – PPO

## 2020-05-29 DIAGNOSIS — J309 Allergic rhinitis, unspecified: Secondary | ICD-10-CM

## 2020-06-11 ENCOUNTER — Ambulatory Visit (INDEPENDENT_AMBULATORY_CARE_PROVIDER_SITE_OTHER): Payer: BC Managed Care – PPO | Admitting: *Deleted

## 2020-06-11 DIAGNOSIS — J309 Allergic rhinitis, unspecified: Secondary | ICD-10-CM | POA: Diagnosis not present

## 2020-06-23 ENCOUNTER — Ambulatory Visit (INDEPENDENT_AMBULATORY_CARE_PROVIDER_SITE_OTHER): Payer: BC Managed Care – PPO | Admitting: *Deleted

## 2020-06-23 DIAGNOSIS — J309 Allergic rhinitis, unspecified: Secondary | ICD-10-CM

## 2020-07-07 ENCOUNTER — Ambulatory Visit (INDEPENDENT_AMBULATORY_CARE_PROVIDER_SITE_OTHER): Payer: BC Managed Care – PPO | Admitting: *Deleted

## 2020-07-07 DIAGNOSIS — J309 Allergic rhinitis, unspecified: Secondary | ICD-10-CM

## 2020-07-09 HISTORY — PX: ROOT CANAL: SHX2363

## 2020-07-25 ENCOUNTER — Other Ambulatory Visit: Payer: Self-pay | Admitting: *Deleted

## 2020-07-25 MED ORDER — CARBINOXAMINE MALEATE 6 MG PO TABS: 1.0000 | ORAL_TABLET | Freq: Four times a day (QID) | ORAL | 5 refills | Status: DC | PRN

## 2020-07-29 ENCOUNTER — Ambulatory Visit (INDEPENDENT_AMBULATORY_CARE_PROVIDER_SITE_OTHER): Payer: BC Managed Care – PPO | Admitting: *Deleted

## 2020-07-29 DIAGNOSIS — J309 Allergic rhinitis, unspecified: Secondary | ICD-10-CM | POA: Diagnosis not present

## 2020-08-13 ENCOUNTER — Ambulatory Visit (INDEPENDENT_AMBULATORY_CARE_PROVIDER_SITE_OTHER): Payer: BC Managed Care – PPO

## 2020-08-13 DIAGNOSIS — J309 Allergic rhinitis, unspecified: Secondary | ICD-10-CM

## 2020-08-21 ENCOUNTER — Ambulatory Visit (INDEPENDENT_AMBULATORY_CARE_PROVIDER_SITE_OTHER): Payer: BC Managed Care – PPO | Admitting: *Deleted

## 2020-08-21 DIAGNOSIS — J309 Allergic rhinitis, unspecified: Secondary | ICD-10-CM

## 2020-09-02 ENCOUNTER — Ambulatory Visit (INDEPENDENT_AMBULATORY_CARE_PROVIDER_SITE_OTHER): Payer: BC Managed Care – PPO

## 2020-09-02 DIAGNOSIS — J309 Allergic rhinitis, unspecified: Secondary | ICD-10-CM

## 2020-09-16 ENCOUNTER — Ambulatory Visit (INDEPENDENT_AMBULATORY_CARE_PROVIDER_SITE_OTHER): Payer: BC Managed Care – PPO | Admitting: *Deleted

## 2020-09-16 DIAGNOSIS — J309 Allergic rhinitis, unspecified: Secondary | ICD-10-CM | POA: Diagnosis not present

## 2020-10-06 ENCOUNTER — Ambulatory Visit (INDEPENDENT_AMBULATORY_CARE_PROVIDER_SITE_OTHER): Payer: BC Managed Care – PPO

## 2020-10-06 DIAGNOSIS — J309 Allergic rhinitis, unspecified: Secondary | ICD-10-CM | POA: Diagnosis not present

## 2020-10-08 ENCOUNTER — Ambulatory Visit: Payer: BC Managed Care – PPO | Admitting: Allergy and Immunology

## 2020-10-09 ENCOUNTER — Encounter: Payer: Self-pay | Admitting: Allergy & Immunology

## 2020-10-09 ENCOUNTER — Ambulatory Visit (INDEPENDENT_AMBULATORY_CARE_PROVIDER_SITE_OTHER): Payer: BC Managed Care – PPO | Admitting: Allergy & Immunology

## 2020-10-09 ENCOUNTER — Other Ambulatory Visit: Payer: Self-pay

## 2020-10-09 VITALS — BP 114/76 | HR 76 | Temp 98.3°F | Resp 16

## 2020-10-09 DIAGNOSIS — J4530 Mild persistent asthma, uncomplicated: Secondary | ICD-10-CM | POA: Diagnosis not present

## 2020-10-09 DIAGNOSIS — J3089 Other allergic rhinitis: Secondary | ICD-10-CM

## 2020-10-09 DIAGNOSIS — K219 Gastro-esophageal reflux disease without esophagitis: Secondary | ICD-10-CM | POA: Diagnosis not present

## 2020-10-09 MED ORDER — TRIAMCINOLONE ACETONIDE 0.5 % EX OINT: 1.0000 "application " | TOPICAL_OINTMENT | Freq: Two times a day (BID) | CUTANEOUS | 5 refills | Status: DC | PRN

## 2020-10-09 NOTE — Patient Instructions (Addendum)
1. Intermittent asthma, uncomplicated - Lung testing not done today. - It seems that all of your symptoms are related to environmental triggers. - We are going to continue with albuterol as needed.  2. Seasonal and perennial allergic rhinitis - on allergen immunotherapy  - We are going to add epi rinses to your allergy shots. - Come back once weekly so we can get your allergy shots advanced. - We can decrease the allergens in the vials if needed. - Be sure to take the antihistamine on the day of your injections. - Add on triamcinolone 0.5% ointment applied to your injection sites IMMEDIATELY after the shots.  3. Return in about 6 months (around 04/11/2021).    Please inform us of any Emergency Department visits, hospitalizations, or changes in symptoms. Call us before going to the ED for breathing or allergy symptoms since we might be able to fit you in for a sick visit. Feel free to contact us anytime with any questions, problems, or concerns.  It was a pleasure to meet you today!  Websites that have reliable patient information: 1. American Academy of Asthma, Allergy, and Immunology: www.aaaai.org 2. Food Allergy Research and Education (FARE): foodallergy.org 3. Mothers of Asthmatics: http://www.asthmacommunitynetwork.org 4. American College of Allergy, Asthma, and Immunology: www.acaai.org   COVID-19 Vaccine Information can be found at: [email or web address] For questions related to vaccine distribution or appointments, please email [email or web address] or call [phone].   We realize that you might be concerned about having an allergic reaction to the COVID19 vaccines. To help with that concern, WE ARE OFFERING THE COVID19 VACCINES IN OUR OFFICE! Ask the front desk for dates!     "Like" [place] on Facebook and Instagram for our latest updates!      A healthy democracy works best when [employer] participate! Make sure you  are registered to vote! If you have moved or changed any of your contact information, you will need to get this updated before voting!  In some cases, you MAY be able to register to vote online: [email or web address]

## 2020-10-09 NOTE — Progress Notes (Signed)
FOLLOW UP  Date of Service/Encounter:  10/09/20   Assessment:   Mild persistent asthma, uncomplicated - likely allergy triggered  Perennial and seasonal allergic rhinitis - on allergen immunotherapy but with large local reactions and inability to advance  Plan/Recommendations:   1. Intermittent asthma, uncomplicated - Lung testing not done today. - It seems that all of your symptoms are related to environmental triggers. - We are going to continue with albuterol as needed.  2. Seasonal and perennial allergic rhinitis - on allergen immunotherapy  - We are going to add epi rinses to your allergy shots. - Come back once weekly so we can get your allergy shots advanced. - We can decrease the allergens in the vials if needed. - Be sure to take the antihistamine on the day of your injections. - Add on triamcinolone 0.5% ointment applied to your injection sites IMMEDIATELY after the shots.  3. Return in about 6 months (around 04/11/2021).   Subjective:   Stacy Tran is a 50 y.o. female presenting today for follow up of  Chief Complaint  Patient presents with  . Allergies    Stacy Tran has a history of the following: Patient Active Problem List   Diagnosis Date Noted  . Allergic conjunctivitis 06/25/2019  . Acid reflux 02/19/2019  . Mild persistent asthma 01/08/2019  . Seasonal and perennial allergic rhinitis 01/08/2019  . Cough, persistent 01/08/2019    History obtained from: chart review and patient.  Stacy Tran is a 50 y.o. female presenting for a follow up visit.  She was last seen in September 2021.  At that time, Dr. Nunzio Cobbs started her on Singulair for her asthma and continue with albuterol as needed.  For her allergic rhinitis, she was continued on allergen immunotherapy as well as Astelin and RyVent.  She is also continued on as needed Pepcid for her reflux.  In the interim, she has had quite a few problems.  She is very frustrated today because she has  not made any headway on her allergen immunotherapy.  Review of the chart shows that she has been frozen at 0.3 for the entirety of 2021, although in the directions it says only until fall.  At this point, she wants to just quit her allergy shots because she does not feel that she is getting anywhere.  It always always seems to be the pollen containing vial that causes issues, but her mold containing bile never gets advanced at all.  Asthma/Respiratory Symptom History: Her asthma is under good control.  With the allergy shots, she does not need to take anything on a daily basis for her asthma.  She has not needed to use her albuterol in over a year.  ACT score is 25, indicating excellent asthma control.  Allergic Rhinitis Symptom History: She does have RyVent that she uses as needed.  She had a switch to a different pharmacy which is quite a bit cheaper and she is very excited about that.  She has not needed antibiotics at all.   Stacy Tran is on allergen immunotherapy. She receives two injections. Immunotherapy script #1 contains molds, cat and dog. She currently receives 0.47mL of the GREEN vial (1/1,000). Immunotherapy script #2 contains trees, grasses and dust mites. She currently receives 0.69mL of the GREEN vial (1/1,000). She started shots July of 2020 and not yet reached maintenance.   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  Specifically, she works on [employer] including the [employer]  [place] [employer] [place] as well as the [place] festival..  Otherwise, there have been no changes to her past medical history, surgical history, family history, or social history.    Review of Systems  Constitutional: Negative.  Negative for chills, fever, malaise/fatigue and weight loss.  HENT: Positive for congestion and sinus pain. Negative for ear discharge and ear pain.   Eyes: Negative for pain, discharge and redness.  Respiratory: Negative for cough, sputum production, shortness  of breath and wheezing.   Cardiovascular: Negative.  Negative for chest pain and palpitations.  Gastrointestinal: Negative for abdominal pain, constipation, diarrhea, heartburn, nausea and vomiting.  Skin: Negative.  Negative for itching and rash.  Neurological: Negative for dizziness and headaches.  Endo/Heme/Allergies: Positive for environmental allergies. Does not bruise/bleed easily.       Objective:   Blood pressure 114/76, pulse 76, temperature 98.3 F (36.8 C), temperature source Temporal, resp. rate 16, SpO2 96 %. There is no height or weight on file to calculate BMI.   Physical Exam:  Physical Exam Constitutional:      Appearance: She is well-developed.     Comments: Somewhat defensive when I first walk in, but clams down as we talk more.   HENT:     Head: Normocephalic and atraumatic.     Right Ear: Tympanic membrane, ear canal and external ear normal.     Left Ear: Tympanic membrane, ear canal and external ear normal.     Nose: No nasal deformity, septal deviation, mucosal edema, rhinorrhea or epistaxis.     Right Turbinates: Enlarged and swollen.     Left Turbinates: Enlarged and swollen.     Right Sinus: No maxillary sinus tenderness or frontal sinus tenderness.     Left Sinus: No maxillary sinus tenderness or frontal sinus tenderness.     Mouth/Throat:     Mouth: Oropharynx is clear and moist. Mucous membranes are not pale and not dry.     Pharynx: Uvula midline.  Eyes:     General:        Right eye: No discharge.        Left eye: No discharge.     Extraocular Movements: EOM normal.     Conjunctiva/sclera: Conjunctivae normal.     Right eye: Right conjunctiva is not injected. No chemosis.    Left eye: Left conjunctiva is not injected. No chemosis.    Pupils: Pupils are equal, round, and reactive to light.  Cardiovascular:     Rate and Rhythm: Normal rate and regular rhythm.     Heart sounds: Normal heart sounds.  Pulmonary:     Effort: Pulmonary effort is  normal. No tachypnea, accessory muscle usage or respiratory distress.     Breath sounds: Normal breath sounds. No wheezing, rhonchi or rales.  Chest:     Chest wall: No tenderness.  Lymphadenopathy:     Cervical: No cervical adenopathy.  Skin:    Coloration: Skin is not pale.     Findings: No abrasion, erythema, petechiae or rash. Rash is not papular, urticarial or vesicular.  Neurological:     Mental Status: She is alert.  Psychiatric:        Mood and Affect: Mood and affect normal.      Diagnostic studies: none      Stacy Bonds, MD  [facility]

## 2020-10-29 ENCOUNTER — Ambulatory Visit: Payer: Self-pay

## 2020-10-30 ENCOUNTER — Ambulatory Visit (INDEPENDENT_AMBULATORY_CARE_PROVIDER_SITE_OTHER): Payer: BC Managed Care – PPO | Admitting: *Deleted

## 2020-10-30 DIAGNOSIS — J309 Allergic rhinitis, unspecified: Secondary | ICD-10-CM | POA: Diagnosis not present

## 2020-11-05 ENCOUNTER — Ambulatory Visit (INDEPENDENT_AMBULATORY_CARE_PROVIDER_SITE_OTHER): Payer: BC Managed Care – PPO | Admitting: *Deleted

## 2020-11-05 DIAGNOSIS — J309 Allergic rhinitis, unspecified: Secondary | ICD-10-CM

## 2020-11-12 ENCOUNTER — Ambulatory Visit (INDEPENDENT_AMBULATORY_CARE_PROVIDER_SITE_OTHER): Payer: BC Managed Care – PPO

## 2020-11-12 DIAGNOSIS — J309 Allergic rhinitis, unspecified: Secondary | ICD-10-CM | POA: Diagnosis not present

## 2020-11-19 ENCOUNTER — Ambulatory Visit (INDEPENDENT_AMBULATORY_CARE_PROVIDER_SITE_OTHER): Payer: BC Managed Care – PPO

## 2020-11-19 DIAGNOSIS — J309 Allergic rhinitis, unspecified: Secondary | ICD-10-CM | POA: Diagnosis not present

## 2020-11-24 ENCOUNTER — Ambulatory Visit (INDEPENDENT_AMBULATORY_CARE_PROVIDER_SITE_OTHER): Payer: BC Managed Care – PPO | Admitting: *Deleted

## 2020-11-24 DIAGNOSIS — J309 Allergic rhinitis, unspecified: Secondary | ICD-10-CM | POA: Diagnosis not present

## 2020-12-03 ENCOUNTER — Ambulatory Visit (INDEPENDENT_AMBULATORY_CARE_PROVIDER_SITE_OTHER): Payer: BC Managed Care – PPO

## 2020-12-03 DIAGNOSIS — J309 Allergic rhinitis, unspecified: Secondary | ICD-10-CM | POA: Diagnosis not present

## 2020-12-11 DIAGNOSIS — J3089 Other allergic rhinitis: Secondary | ICD-10-CM

## 2020-12-11 NOTE — Progress Notes (Signed)
VIALS EXP 12-11-21 

## 2020-12-16 ENCOUNTER — Ambulatory Visit (INDEPENDENT_AMBULATORY_CARE_PROVIDER_SITE_OTHER): Payer: BC Managed Care – PPO | Admitting: *Deleted

## 2020-12-16 DIAGNOSIS — J309 Allergic rhinitis, unspecified: Secondary | ICD-10-CM

## 2020-12-25 ENCOUNTER — Ambulatory Visit (INDEPENDENT_AMBULATORY_CARE_PROVIDER_SITE_OTHER): Payer: BC Managed Care – PPO

## 2020-12-25 DIAGNOSIS — J309 Allergic rhinitis, unspecified: Secondary | ICD-10-CM | POA: Diagnosis not present

## 2021-01-09 ENCOUNTER — Ambulatory Visit (INDEPENDENT_AMBULATORY_CARE_PROVIDER_SITE_OTHER): Payer: BC Managed Care – PPO

## 2021-01-09 DIAGNOSIS — J309 Allergic rhinitis, unspecified: Secondary | ICD-10-CM

## 2021-01-14 ENCOUNTER — Ambulatory Visit (INDEPENDENT_AMBULATORY_CARE_PROVIDER_SITE_OTHER): Payer: BC Managed Care – PPO

## 2021-01-14 DIAGNOSIS — J309 Allergic rhinitis, unspecified: Secondary | ICD-10-CM | POA: Diagnosis not present

## 2021-01-21 ENCOUNTER — Ambulatory Visit (INDEPENDENT_AMBULATORY_CARE_PROVIDER_SITE_OTHER): Payer: BC Managed Care – PPO

## 2021-01-21 DIAGNOSIS — J309 Allergic rhinitis, unspecified: Secondary | ICD-10-CM

## 2021-01-30 ENCOUNTER — Ambulatory Visit (INDEPENDENT_AMBULATORY_CARE_PROVIDER_SITE_OTHER): Payer: BC Managed Care – PPO

## 2021-01-30 DIAGNOSIS — J309 Allergic rhinitis, unspecified: Secondary | ICD-10-CM

## 2021-02-12 ENCOUNTER — Ambulatory Visit (INDEPENDENT_AMBULATORY_CARE_PROVIDER_SITE_OTHER): Payer: BC Managed Care – PPO | Admitting: *Deleted

## 2021-02-12 DIAGNOSIS — J309 Allergic rhinitis, unspecified: Secondary | ICD-10-CM

## 2021-02-18 ENCOUNTER — Other Ambulatory Visit: Payer: Self-pay

## 2021-02-18 MED ORDER — CARBINOXAMINE MALEATE 4 MG PO TABS: 1.0000 | ORAL_TABLET | Freq: Two times a day (BID) | ORAL | 2 refills | Status: DC | PRN

## 2021-02-23 ENCOUNTER — Ambulatory Visit (INDEPENDENT_AMBULATORY_CARE_PROVIDER_SITE_OTHER): Payer: BC Managed Care – PPO | Admitting: *Deleted

## 2021-02-23 DIAGNOSIS — J309 Allergic rhinitis, unspecified: Secondary | ICD-10-CM | POA: Diagnosis not present

## 2021-02-24 ENCOUNTER — Other Ambulatory Visit: Payer: Self-pay

## 2021-02-24 MED ORDER — CARBINOXAMINE MALEATE 6 MG PO TABS: 1.0000 | ORAL_TABLET | Freq: Four times a day (QID) | ORAL | 2 refills | Status: DC | PRN

## 2021-02-25 ENCOUNTER — Telehealth: Payer: BC Managed Care – PPO | Admitting: *Deleted

## 2021-02-25 NOTE — Telephone Encounter (Signed)
Received PA request from [facility] for Ryvent 6 mg tabs. This rx should be sent to [facility] pharmacy. I will contact pt to inform and provide pharmacy info.

## 2021-02-27 MED ORDER — CARBINOXAMINE MALEATE 6 MG PO TABS: 1.0000 | ORAL_TABLET | Freq: Four times a day (QID) | ORAL | 5 refills | Status: DC | PRN

## 2021-02-27 NOTE — Telephone Encounter (Signed)
I called patient and left a message for the patient to call [facility] [phone]) to schedule  Ryvent shipment and to call our office with any questions or concern.

## 2021-02-27 NOTE — Telephone Encounter (Signed)
Tried calling pt on home and cell. Could not leave message on cell, left a message on home machine. We no longer use [facility] pharmacy. I sent Ryvent to [facility] pharmacy. This pharmacy should reach out to her to confirm info and schedule shipment. [facility] number is [phone].

## 2021-03-11 ENCOUNTER — Ambulatory Visit (INDEPENDENT_AMBULATORY_CARE_PROVIDER_SITE_OTHER): Payer: BC Managed Care – PPO | Admitting: *Deleted

## 2021-03-11 DIAGNOSIS — J309 Allergic rhinitis, unspecified: Secondary | ICD-10-CM

## 2021-03-27 ENCOUNTER — Ambulatory Visit (INDEPENDENT_AMBULATORY_CARE_PROVIDER_SITE_OTHER): Payer: BC Managed Care – PPO

## 2021-03-27 DIAGNOSIS — J309 Allergic rhinitis, unspecified: Secondary | ICD-10-CM | POA: Diagnosis not present

## 2021-04-08 ENCOUNTER — Ambulatory Visit (INDEPENDENT_AMBULATORY_CARE_PROVIDER_SITE_OTHER): Payer: BC Managed Care – PPO

## 2021-04-08 DIAGNOSIS — J309 Allergic rhinitis, unspecified: Secondary | ICD-10-CM

## 2021-04-22 ENCOUNTER — Encounter: Payer: Self-pay | Admitting: Allergy & Immunology

## 2021-04-24 MED ORDER — NIRMATRELVIR/RITONAVIR (PAXLOVID)TABLET: 3.0000 | ORAL_TABLET | Freq: Two times a day (BID) | ORAL | 0 refills | Status: AC

## 2021-04-28 ENCOUNTER — Ambulatory Visit: Payer: BC Managed Care – PPO | Admitting: Allergy & Immunology

## 2021-05-05 ENCOUNTER — Telehealth: Payer: Self-pay

## 2021-05-05 NOTE — Telephone Encounter (Signed)
Patient called and expressed that she was just getting over Covid.She tested negative on Saturday. Patient asked if she could get her immunotherapy injections since she is not Covid negative. Patient also expressed that is dealing with ant bites that are itching. I tried to make her a sick visit appointment but we were all full.  I asked if patient could wait until the next day and she denied that she could and that she would go to urgent care. Patient asked if she could come in for her immunotherapy injections and I informed her that she should wait after confirming with Beth. Patient verbally expressed understanding and scheduled an appointment with Dr. Dellis Anes for two weeks for her follow up visit.

## 2021-05-05 NOTE — Telephone Encounter (Signed)
Can we have her send some pictures of these ant bites?  We could send in prednisone if she needs some or at least a topical steroid.  Malachi Bonds, MD [facility]

## 2021-05-14 ENCOUNTER — Ambulatory Visit (INDEPENDENT_AMBULATORY_CARE_PROVIDER_SITE_OTHER): Payer: BC Managed Care – PPO | Admitting: *Deleted

## 2021-05-14 DIAGNOSIS — J309 Allergic rhinitis, unspecified: Secondary | ICD-10-CM

## 2021-05-19 ENCOUNTER — Encounter: Payer: Self-pay | Admitting: Allergy & Immunology

## 2021-05-19 ENCOUNTER — Ambulatory Visit: Payer: Self-pay

## 2021-05-19 ENCOUNTER — Ambulatory Visit (INDEPENDENT_AMBULATORY_CARE_PROVIDER_SITE_OTHER): Payer: BC Managed Care – PPO | Admitting: Allergy & Immunology

## 2021-05-19 ENCOUNTER — Other Ambulatory Visit: Payer: Self-pay

## 2021-05-19 VITALS — BP 124/80 | HR 68 | Temp 97.6°F | Resp 16 | Ht 62.3 in | Wt 254.2 lb

## 2021-05-19 DIAGNOSIS — J309 Allergic rhinitis, unspecified: Secondary | ICD-10-CM | POA: Diagnosis not present

## 2021-05-19 DIAGNOSIS — K219 Gastro-esophageal reflux disease without esophagitis: Secondary | ICD-10-CM | POA: Diagnosis not present

## 2021-05-19 DIAGNOSIS — J4530 Mild persistent asthma, uncomplicated: Secondary | ICD-10-CM | POA: Diagnosis not present

## 2021-05-19 DIAGNOSIS — J3089 Other allergic rhinitis: Secondary | ICD-10-CM

## 2021-05-19 MED ORDER — ALBUTEROL SULFATE HFA 108 (90 BASE) MCG/ACT IN AERS: 2.0000 | INHALATION_SPRAY | RESPIRATORY_TRACT | 1 refills | Status: DC | PRN

## 2021-05-19 MED ORDER — BUDESONIDE-FORMOTEROL FUMARATE 160-4.5 MCG/ACT IN AERO: 2.0000 | INHALATION_SPRAY | Freq: Two times a day (BID) | RESPIRATORY_TRACT | 5 refills | Status: DC

## 2021-05-19 MED ORDER — EPINEPHRINE 0.3 MG/0.3ML IJ SOAJ: 0.3000 mg | INTRAMUSCULAR | 1 refills | Status: DC | PRN

## 2021-05-19 NOTE — Progress Notes (Signed)
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 Intermittent asthma, uncomplicated - Lung testing not done today. - Continue with the same medications.  - Daily controller medication(s): NOTHING - Prior to physical activity: albuterol 2 puffs 10-15 minutes before physical activity. - Rescue medications: albuterol 4 puffs every 4-6 hours as needed - Changes during respiratory infections or worsening symptoms: Add on Symbicort 160/4.5 two puffs twice daily to 2 puffs twice daily for TWO WEEKS. - Asthma control goals:  * Full participation in all desired activities (may need albuterol before activity) * Albuterol use two time or less a week on average (not counting use with activity) * Cough interfering with sleep two time or less a month * Oral steroids no more than once a year * No hospitalizations  2. Seasonal and perennial allergic rhinitis - on allergen immunotherapy  - We are not going to make any medications changes. - Be sure to take the antihistamine on the day of your injections. - Continue with triamcinolone 0.5% ointment applied to your injection sites IMMEDIATELY after the shots. - Try a low histamine diet.   3. Return in about 6 months (around 11/17/2021).   Subjective:   Stacy Tran is a 50 y.o. female presenting today for follow up of  Chief Complaint  Patient presents with   Allergic Rhinitis    Asthma    Stacy Tran has a history of the following: Patient Active Problem List   Diagnosis Date Noted   Allergic conjunctivitis 06/25/2019   Acid reflux 02/19/2019   Mild persistent asthma 01/08/2019   Seasonal and perennial allergic rhinitis  01/08/2019   Cough, persistent 01/08/2019    History obtained from: chart review and patient.  Stacy Tran is a 50 y.o. female presenting for a follow up visit. She was last seen in March 2022. At that time, lung testing was not done. We felt that her symptoms were all controlled with the allergy shots and albuterol as needed. We added epinephrine rinses to help with her large local reactions. We recommended taking antihistamines on injection days and added on triamcinolone 0.5% ointment applied immediately after the shots.   In the interim, she has done well.   Asthma/Respiratory Symptom History: She does need some refills of her Symbicort. She uses this only PRN when she has flares. She has not needed it in quite some time. She had built up a reserve of it after first establishing care with [place].  She has not needed prednisone and has not been to the ED for her symptoms. She has been doing well overall.   Allergic Rhinitis Symptom History: She remains on her antihistamine daily. She is also doing well with her nasal spray. She has not needed antibiotics recently for any sinus infections, although she did get one with her recent UTI.   Stacy Tran is on allergen immunotherapy. She receives two injections. Immunotherapy script #1 contains molds, cat, and dog. She currently receives 0.50mL of the GREEN vial (1/1,000). Immunotherapy script #2 contains trees, grasses, and dust mites. She currently receives 0.33mL of the GREEN vial (1/1,000). She started shots June of 2020 and not yet reached maintenance. The epi rinses are working well. She  is still doing the topical steroid afterwards. This is allowing her to get the increasing doses.   She has mostly done well since the last visit. She ended up catching COVID after the [facility] [employer]. She had COVID and an UTI and ant bites. She is fully vaccinated.   She reports that she gets a lot of swelling from ant bites. This is not anaphylactic and she denies that  the swelling crosses joint lines. It is not fire ants either. However, it takes weeks for the swelling to improve. She has never needed prednisone or epinephrine for these reactions. She is wondering if there is anything that can be done.  Otherwise, there have been no changes to her past medical history, surgical history, family history, or social history.    Review of Systems  Constitutional: Negative.  Negative for fever, malaise/fatigue and weight loss.  HENT: Negative.  Negative for congestion, ear discharge and ear pain.        Positive for sneezing. Positive for rhinorrhea.   Eyes:  Negative for pain, discharge and redness.  Respiratory:  Negative for cough, sputum production, shortness of breath and wheezing.   Cardiovascular: Negative.  Negative for chest pain and palpitations.  Gastrointestinal:  Negative for abdominal pain, heartburn, nausea and vomiting.  Skin: Negative.  Negative for itching and rash.  Neurological:  Negative for dizziness and headaches.  Endo/Heme/Allergies:  Negative for environmental allergies. Does not bruise/bleed easily.      Objective:   Blood pressure 124/80, pulse 68, temperature 97.6 F (36.4 C), resp. rate 16, height 5' 2.3" (1.582 m), weight 254 lb 3.2 oz (115.3 kg), SpO2 96 %. Body mass index is 46.05 kg/m.   Physical Exam:  Physical Exam Vitals reviewed.  Constitutional:      Appearance: She is well-developed.  HENT:     Head: Normocephalic and atraumatic.     Right Ear: Tympanic membrane, ear canal and external ear normal.     Left Ear: Tympanic membrane, ear canal and external ear normal.     Nose: Rhinorrhea present. No nasal deformity, septal deviation or mucosal edema.     Right Turbinates: Enlarged and swollen.     Left Turbinates: Enlarged and swollen.     Right Sinus: No maxillary sinus tenderness or frontal sinus tenderness.     Left Sinus: No maxillary sinus tenderness or frontal sinus tenderness.     Mouth/Throat:      Mouth: Mucous membranes are not pale and not dry.     Pharynx: Uvula midline.  Eyes:     General: Lids are normal. Allergic shiner present.        Right eye: No discharge.        Left eye: No discharge.     Conjunctiva/sclera: Conjunctivae normal.     Right eye: Right conjunctiva is not injected. No chemosis.    Left eye: Left conjunctiva is not injected. No chemosis.    Pupils: Pupils are equal, round, and reactive to light.  Cardiovascular:     Rate and Rhythm: Normal rate and regular rhythm.     Heart sounds: Normal heart sounds.  Pulmonary:     Effort: Pulmonary effort is normal. No tachypnea, accessory muscle usage or respiratory distress.     Breath sounds: Normal breath sounds. No wheezing, rhonchi or rales.  Chest:     Chest wall: No tenderness.  Lymphadenopathy:     Cervical: No cervical adenopathy.  Skin:    General: Skin is warm.  Capillary Refill: Capillary refill takes less than 2 seconds.     Coloration: Skin is not pale.     Findings: No abrasion, erythema, petechiae or rash. Rash is not papular, urticarial or vesicular.     Comments: She does have swollen ankles bilaterally secondary to recent ant bites. No vesicles at this point.   Neurological:     Mental Status: She is alert.  Psychiatric:        Behavior: Behavior is cooperative.     Diagnostic studies: none    Malachi Bonds, MD  [facility]

## 2021-05-19 NOTE — Patient Instructions (Addendum)
1. Intermittent asthma, uncomplicated - Lung testing not done today. - Continue with the same medications.  - Daily controller medication(s): NOTHING - Prior to physical activity: albuterol 2 puffs 10-15 minutes before physical activity. - Rescue medications: albuterol 4 puffs every 4-6 hours as needed - Changes during respiratory infections or worsening symptoms: Add on Symbicort 160/4.5 two puffs twice daily to 2 puffs twice daily for TWO WEEKS. - Asthma control goals:  * Full participation in all desired activities (may need albuterol before activity) * Albuterol use two time or less a week on average (not counting use with activity) * Cough interfering with sleep two time or less a month * Oral steroids no more than once a year * No hospitalizations  2. Seasonal and perennial allergic rhinitis - on allergen immunotherapy  - We are not going to make any medications changes. - Be sure to take the antihistamine on the day of your injections. - Continue with triamcinolone 0.5% ointment applied to your injection sites IMMEDIATELY after the shots. - Try a low histamine diet.   3. Return in about 6 months (around 11/17/2021).    Please inform us of any Emergency Department visits, hospitalizations, or changes in symptoms. Call us before going to the ED for breathing or allergy symptoms since we might be able to fit you in for a sick visit. Feel free to contact us anytime with any questions, problems, or concerns.  It was a pleasure to see you again today!  Websites that have reliable patient information: 1. American Academy of Asthma, Allergy, and Immunology: www.aaaai.org 2. Food Allergy Research and Education (FARE): foodallergy.org 3. Mothers of Asthmatics: http://www.asthmacommunitynetwork.org 4. American College of Allergy, Asthma, and Immunology: www.acaai.org   COVID-19 Vaccine Information can be found at:  [email or web address] For questions related to vaccine distribution or appointments, please email [email or web address] or call [phone].   We realize that you might be concerned about having an allergic reaction to the COVID19 vaccines. To help with that concern, WE ARE OFFERING THE COVID19 VACCINES IN OUR OFFICE! Ask the front desk for dates!     "Like" [place] on Facebook and Instagram for our latest updates!      A healthy democracy works best when [employer] participate! Make sure you are registered to vote! If you have moved or changed any of your contact information, you will need to get this updated before voting!  In some cases, you MAY be able to register to vote online: [email or web address]

## 2021-05-20 ENCOUNTER — Encounter: Payer: Self-pay | Admitting: Allergy & Immunology

## 2021-06-02 ENCOUNTER — Ambulatory Visit (INDEPENDENT_AMBULATORY_CARE_PROVIDER_SITE_OTHER): Payer: BC Managed Care – PPO | Admitting: *Deleted

## 2021-06-02 DIAGNOSIS — J309 Allergic rhinitis, unspecified: Secondary | ICD-10-CM | POA: Diagnosis not present

## 2021-06-25 ENCOUNTER — Ambulatory Visit (INDEPENDENT_AMBULATORY_CARE_PROVIDER_SITE_OTHER): Payer: BC Managed Care – PPO | Admitting: *Deleted

## 2021-06-25 DIAGNOSIS — J309 Allergic rhinitis, unspecified: Secondary | ICD-10-CM

## 2021-07-10 ENCOUNTER — Ambulatory Visit (INDEPENDENT_AMBULATORY_CARE_PROVIDER_SITE_OTHER): Payer: BC Managed Care – PPO

## 2021-07-10 DIAGNOSIS — J309 Allergic rhinitis, unspecified: Secondary | ICD-10-CM

## 2021-08-14 ENCOUNTER — Ambulatory Visit (INDEPENDENT_AMBULATORY_CARE_PROVIDER_SITE_OTHER): Payer: 59 | Admitting: *Deleted

## 2021-08-14 DIAGNOSIS — J309 Allergic rhinitis, unspecified: Secondary | ICD-10-CM

## 2021-08-18 DIAGNOSIS — J029 Acute pharyngitis, unspecified: Secondary | ICD-10-CM | POA: Diagnosis not present

## 2021-08-18 DIAGNOSIS — Z03818 Encounter for observation for suspected exposure to other biological agents ruled out: Secondary | ICD-10-CM | POA: Diagnosis not present

## 2021-08-20 ENCOUNTER — Ambulatory Visit (INDEPENDENT_AMBULATORY_CARE_PROVIDER_SITE_OTHER): Payer: 59

## 2021-08-20 DIAGNOSIS — J309 Allergic rhinitis, unspecified: Secondary | ICD-10-CM

## 2021-08-26 ENCOUNTER — Ambulatory Visit (INDEPENDENT_AMBULATORY_CARE_PROVIDER_SITE_OTHER): Payer: 59

## 2021-08-26 DIAGNOSIS — J309 Allergic rhinitis, unspecified: Secondary | ICD-10-CM | POA: Diagnosis not present

## 2021-09-03 ENCOUNTER — Ambulatory Visit (INDEPENDENT_AMBULATORY_CARE_PROVIDER_SITE_OTHER): Payer: 59

## 2021-09-03 DIAGNOSIS — J309 Allergic rhinitis, unspecified: Secondary | ICD-10-CM

## 2021-09-11 ENCOUNTER — Ambulatory Visit (INDEPENDENT_AMBULATORY_CARE_PROVIDER_SITE_OTHER): Payer: 59 | Admitting: *Deleted

## 2021-09-11 DIAGNOSIS — J309 Allergic rhinitis, unspecified: Secondary | ICD-10-CM

## 2021-09-23 ENCOUNTER — Ambulatory Visit (INDEPENDENT_AMBULATORY_CARE_PROVIDER_SITE_OTHER): Payer: 59

## 2021-09-23 DIAGNOSIS — J309 Allergic rhinitis, unspecified: Secondary | ICD-10-CM | POA: Diagnosis not present

## 2021-10-01 ENCOUNTER — Ambulatory Visit (INDEPENDENT_AMBULATORY_CARE_PROVIDER_SITE_OTHER): Payer: 59 | Admitting: *Deleted

## 2021-10-01 DIAGNOSIS — J309 Allergic rhinitis, unspecified: Secondary | ICD-10-CM

## 2021-10-07 ENCOUNTER — Ambulatory Visit (INDEPENDENT_AMBULATORY_CARE_PROVIDER_SITE_OTHER): Payer: 59

## 2021-10-07 DIAGNOSIS — J309 Allergic rhinitis, unspecified: Secondary | ICD-10-CM | POA: Diagnosis not present

## 2021-10-22 ENCOUNTER — Ambulatory Visit (INDEPENDENT_AMBULATORY_CARE_PROVIDER_SITE_OTHER): Payer: 59

## 2021-10-22 DIAGNOSIS — J309 Allergic rhinitis, unspecified: Secondary | ICD-10-CM | POA: Diagnosis not present

## 2021-10-26 DIAGNOSIS — Z124 Encounter for screening for malignant neoplasm of cervix: Secondary | ICD-10-CM | POA: Diagnosis not present

## 2021-10-26 DIAGNOSIS — Z1231 Encounter for screening mammogram for malignant neoplasm of breast: Secondary | ICD-10-CM | POA: Diagnosis not present

## 2021-10-26 DIAGNOSIS — Z01419 Encounter for gynecological examination (general) (routine) without abnormal findings: Secondary | ICD-10-CM | POA: Diagnosis not present

## 2021-10-26 DIAGNOSIS — R69 Illness, unspecified: Secondary | ICD-10-CM | POA: Diagnosis not present

## 2021-10-26 DIAGNOSIS — Z6841 Body Mass Index (BMI) 40.0 and over, adult: Secondary | ICD-10-CM | POA: Diagnosis not present

## 2021-10-29 ENCOUNTER — Ambulatory Visit (INDEPENDENT_AMBULATORY_CARE_PROVIDER_SITE_OTHER): Payer: 59 | Admitting: *Deleted

## 2021-10-29 DIAGNOSIS — J309 Allergic rhinitis, unspecified: Secondary | ICD-10-CM | POA: Diagnosis not present

## 2021-11-05 DIAGNOSIS — J3089 Other allergic rhinitis: Secondary | ICD-10-CM | POA: Diagnosis not present

## 2021-11-05 NOTE — Progress Notes (Signed)
VIALS EXP 11-06-22 ?

## 2021-11-17 ENCOUNTER — Ambulatory Visit: Payer: BC Managed Care – PPO | Admitting: Allergy & Immunology

## 2021-11-25 ENCOUNTER — Ambulatory Visit (INDEPENDENT_AMBULATORY_CARE_PROVIDER_SITE_OTHER): Payer: 59

## 2021-11-25 DIAGNOSIS — J309 Allergic rhinitis, unspecified: Secondary | ICD-10-CM | POA: Diagnosis not present

## 2021-12-08 ENCOUNTER — Ambulatory Visit (INDEPENDENT_AMBULATORY_CARE_PROVIDER_SITE_OTHER): Payer: 59

## 2021-12-08 ENCOUNTER — Ambulatory Visit: Payer: 59 | Admitting: Allergy & Immunology

## 2021-12-08 ENCOUNTER — Encounter: Payer: Self-pay | Admitting: Allergy & Immunology

## 2021-12-08 VITALS — BP 128/82 | HR 80 | Temp 98.1°F | Resp 16 | Ht 62.0 in | Wt 266.1 lb

## 2021-12-08 DIAGNOSIS — J3089 Other allergic rhinitis: Secondary | ICD-10-CM

## 2021-12-08 DIAGNOSIS — J4530 Mild persistent asthma, uncomplicated: Secondary | ICD-10-CM | POA: Diagnosis not present

## 2021-12-08 DIAGNOSIS — J309 Allergic rhinitis, unspecified: Secondary | ICD-10-CM | POA: Diagnosis not present

## 2021-12-08 DIAGNOSIS — K219 Gastro-esophageal reflux disease without esophagitis: Secondary | ICD-10-CM

## 2021-12-08 MED ORDER — TRIAMCINOLONE ACETONIDE 0.5 % EX OINT: 1.0000 "application " | TOPICAL_OINTMENT | Freq: Two times a day (BID) | CUTANEOUS | 5 refills | Status: DC | PRN

## 2021-12-08 MED ORDER — CARBINOXAMINE MALEATE 4 MG PO TABS: 1.0000 | ORAL_TABLET | Freq: Two times a day (BID) | ORAL | 2 refills | Status: DC | PRN

## 2021-12-08 NOTE — Progress Notes (Signed)
? ?FOLLOW UP ? ?Date of Service/Encounter:  12/08/21 ? ? ?Assessment:  ? ?Mild persistent asthma, uncomplicated - likely allergy triggered ?  ?Perennial and seasonal allergic rhinitis - on allergen immunotherapy with improved tolerance with use of epinephrine rinses ?  ?Adverse reaction to ant bites - not anaphylactic in nature ? ?Plan/Recommendations:  ? ?1. Intermittent asthma, uncomplicated ?- Lung testing not done today. ?- Continue with the same medications.  ?- Daily controller medication(s): NOTHING ?- Prior to physical activity: albuterol 2 puffs 10-15 minutes before physical activity. ?- Rescue medications: albuterol 4 puffs every 4-6 hours as needed ?- Changes during respiratory infections or worsening symptoms: Add on Symbicort 160/4.5 two puffs twice daily to 2 puffs twice daily for TWO WEEKS. ?- Asthma control goals:  ?* Full participation in all desired activities (may need albuterol before activity) ?* Albuterol use two time or less a week on average (not counting use with activity) ?* Cough interfering with sleep two time or less a month ?* Oral steroids no more than once a year ?* No hospitalizations ? ?2. Seasonal and perennial allergic rhinitis - on allergen immunotherapy  ?- So glad that medications have been weaned.  ?- Continue with antihistamine on the day of your injections. ?- Continue with triamcinolone 0.5% ointment applied to your injection sites IMMEDIATELY after the shots. ?- We are going to advance the one contain MOLDS/CAT/DOG since you do not have problems with this. ?- We will continue with the Tyrone Schimke as tolerated for the TREES/GRASSES/DM.  ?- Try to come more consistently if you can.  ? ?3. Return in about 6 months (around 06/10/2022).  ? ? ? ?Subjective:  ? ?Arlet Marter is a 51 y.o. female presenting today for follow up of  ?Chief Complaint  ?Patient presents with  ? Follow-up  ? ? ?Chimene Salo has a history of the following: ?Patient Active Problem List  ? Diagnosis  Date Noted  ? Allergic conjunctivitis 06/25/2019  ? Acid reflux 02/19/2019  ? Mild persistent asthma 01/08/2019  ? Seasonal and perennial allergic rhinitis 01/08/2019  ? Cough, persistent 01/08/2019  ? ? ?History obtained from: chart review and patient. ? ?Kamile Fassler is a 51 y.o. female presenting for a follow up visit.  She was last seen in October 2022.  At that time, we did not do lung testing.  We continue with albuterol as needed and Symbicort added during flares.  For allergen immunotherapy.  We did not make changes.  We continued with triamcinolone 0.5% ointment applied to your injection site immediately after the shots.  We also recommended using antihistamine on shot days.  A low histamine diet was discussed as well. ? ?Since the last visit, she has done fairly well. 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 She continues to work on [employer].  ? ? ?Asthma/Respiratory Symptom History: Asthma symptoms are largely controlled. She does not remember the last time that she needed to use her albuterol or her Symbicort. Ling Sue's asthma has been well controlled. She has not required rescue medication, experienced nocturnal awakenings due to lower respiratory symptoms, nor have activities of daily living been limited. She has required no Emergency Department or Urgent Care visits for her asthma. She has required zero courses of systemic steroids for asthma exacerbations since the last visit. ACT score today is 25, indicating excellent asthma symptom control.  ? ? ?Allergic Rhinitis Symptom History: She has been able to wean her medications. She takes  antihistamines only on shot days. She has not needed antibiotics and has not needed to take her antihistamines or nose sprays at all.  ? ?Shirely Toren is on allergen immunotherapy. She receives two injections. Immunotherapy script #1 contains molds, cat, and dog. She currently receives 0.19mL of the GREEN vial (1/1,000). Immunotherapy  script #2 contains trees, grasses, and dust mites. She currently receives 0.78mL of the GREEN vial (1/1,000). She started shots June of 2020 and not yet reached maintenance. The epi rinses are working well. She is still doing the topical steroid afterwards. This is allowing her to get the increasing doses.  ? ?She wants to advance the other one that she never has problems with (the mold, cats, dog). She wants to move forward with this one while the other one lingers if needed.  ? ?She recently got back from a road trip. She and some girlfriends decided to take road trips for each of their 50th birthdays. She has had a good time with this.  ? ?Otherwise, there have been no changes to her past medical history, surgical history, family history, or social history. ? ? ? ?Review of Systems  ?Constitutional: Negative.  Negative for fever, malaise/fatigue and weight loss.  ?HENT: Negative.  Negative for congestion, ear discharge and ear pain.   ?     Positive for sneezing. Positive for rhinorrhea.   ?Eyes:  Negative for pain, discharge and redness.  ?Respiratory:  Negative for cough, sputum production, shortness of breath and wheezing.   ?Cardiovascular: Negative.  Negative for chest pain and palpitations.  ?Gastrointestinal:  Negative for abdominal pain, heartburn, nausea and vomiting.  ?Skin: Negative.  Negative for itching and rash.  ?Neurological:  Negative for dizziness and headaches.  ?Endo/Heme/Allergies:  Negative for environmental allergies. Does not bruise/bleed easily.   ? ? ? ?Objective:  ? ?Blood pressure 128/82, pulse 80, temperature 98.1 ?F (36.7 ?C), resp. rate 16, height 5\' 2"  (1.575 m), weight 266 lb 2 oz (120.7 kg), SpO2 97 %. ?Body mass index is 48.67 kg/m?. ? ? ? ?Physical Exam ?Vitals reviewed.  ?Constitutional:   ?   Appearance: She is well-developed.  ?   Comments: Very lovely.   ?HENT:  ?   Head: Normocephalic and atraumatic.  ?   Right Ear: Tympanic membrane, ear canal and external ear normal.  ?    Left Ear: Tympanic membrane, ear canal and external ear normal.  ?   Nose: Rhinorrhea present. No nasal deformity, septal deviation or mucosal edema.  ?   Right Turbinates: Enlarged, swollen and pale.  ?   Left Turbinates: Enlarged, swollen and pale.  ?   Right Sinus: No maxillary sinus tenderness or frontal sinus tenderness.  ?   Left Sinus: No maxillary sinus tenderness or frontal sinus tenderness.  ?   Comments: No nasal polyps noted.  ?   Mouth/Throat:  ?   Lips: Pink.  ?   Mouth: Mucous membranes are moist. Mucous membranes are not pale and not dry.  ?   Pharynx: Uvula midline.  ?   Comments: Cobblestoning in the posterior oropharynx.  ?Eyes:  ?   General: Lids are normal. Allergic shiner present.     ?   Right eye: No discharge.     ?   Left eye: No discharge.  ?   Conjunctiva/sclera: Conjunctivae normal.  ?   Right eye: Right conjunctiva is not injected. No chemosis. ?   Left eye: Left conjunctiva is not injected. No chemosis. ?  Pupils: Pupils are equal, round, and reactive to light.  ?Cardiovascular:  ?   Rate and Rhythm: Normal rate and regular rhythm.  ?   Heart sounds: Normal heart sounds.  ?Pulmonary:  ?   Effort: Pulmonary effort is normal. No tachypnea, accessory muscle usage or respiratory distress.  ?   Breath sounds: Normal breath sounds. No wheezing, rhonchi or rales.  ?   Comments: Moving air well in all lung fields. No increased work of breathing noted. ?Chest:  ?   Chest wall: No tenderness.  ?Lymphadenopathy:  ?   Cervical: No cervical adenopathy.  ?Skin: ?   General: Skin is warm.  ?   Capillary Refill: Capillary refill takes less than 2 seconds.  ?   Coloration: Skin is not pale.  ?   Findings: No abrasion, erythema, petechiae or rash. Rash is not papular, urticarial or vesicular.  ?   Comments: She does have swollen ankles bilaterally secondary to recent ant bites. No vesicles at this point.   ?Neurological:  ?   Mental Status: She is alert.  ?Psychiatric:     ?   Behavior: Behavior is  cooperative.  ?  ? ?Diagnostic studies: none ? ? ? ? ? ?  ?Malachi BondsJoel Khyle Goodell, MD  ?[facility] ? ? ? ? ? ? ?

## 2021-12-08 NOTE — Patient Instructions (Addendum)
1. Intermittent asthma, uncomplicated ?- Lung testing not done today. ?- Continue with the same medications.  ?- Daily controller medication(s): NOTHING ?- Prior to physical activity: albuterol 2 puffs 10-15 minutes before physical activity. ?- Rescue medications: albuterol 4 puffs every 4-6 hours as needed ?- Changes during respiratory infections or worsening symptoms: Add on Symbicort 160/4.5 two puffs twice daily to 2 puffs twice daily for TWO WEEKS. ?- Asthma control goals:  ?* Full participation in all desired activities (may need albuterol before activity) ?* Albuterol use two time or less a week on average (not counting use with activity) ?* Cough interfering with sleep two time or less a month ?* Oral steroids no more than once a year ?* No hospitalizations ? ?2. Seasonal and perennial allergic rhinitis - on allergen immunotherapy  ?- So glad that medications have been weaned.  ?- Continue with antihistamine on the day of your injections. ?- Continue with triamcinolone 0.5% ointment applied to your injection sites IMMEDIATELY after the shots. ?- We are going to advance the one contain MOLDS/CAT/DOG since you do not have problems with this. ?- We will continue with the Tyrone Schimke as tolerated for the TREES/GRASSES/DM.  ?- Try to come more consistently if you can.  ? ?3. Return in about 6 months (around 06/10/2022).  ? ? ?Please inform us of any Emergency Department visits, hospitalizations, or changes in symptoms. Call us before going to the ED for breathing or allergy symptoms since we might be able to fit you in for a sick visit. Feel free to contact us anytime with any questions, problems, or concerns. ? ?It was a pleasure to see you again today! ? ?Websites that have reliable patient information: ?1. American Academy of Asthma, Allergy, and Immunology: www.aaaai.org ?2. Food Allergy Research and Education (FARE): foodallergy.org ?3. Mothers of Asthmatics: http://www.asthmacommunitynetwork.org ?4. [employer] of Allergy, Asthma, and Immunology: [email or web address].ca ? ? ?COVID-19 Vaccine Information can be found at: PodExchange.nl For questions related to vaccine distribution or appointments, please email [email or web address] or call [phone].  ? ?We realize that you might be concerned about having an allergic reaction to the COVID19 vaccines. To help with that concern, WE ARE OFFERING THE COVID19 VACCINES IN OUR OFFICE! Ask the front desk for dates!  ? ? ? ??Like? [place] on Facebook and Instagram for our latest updates!  ?  ? ? ?A healthy democracy works best when [employer] participate! Make sure you are registered to vote! If you have moved or changed any of your contact information, you will need to get this updated before voting! ? ?In some cases, you MAY be able to register to vote online: [email or web address] ? ? ? ? ?

## 2021-12-10 ENCOUNTER — Encounter: Payer: Self-pay | Admitting: Allergy & Immunology

## 2022-01-01 ENCOUNTER — Ambulatory Visit (INDEPENDENT_AMBULATORY_CARE_PROVIDER_SITE_OTHER): Payer: 59

## 2022-01-01 DIAGNOSIS — J309 Allergic rhinitis, unspecified: Secondary | ICD-10-CM | POA: Diagnosis not present

## 2022-02-03 ENCOUNTER — Encounter: Payer: Self-pay | Admitting: Allergy & Immunology

## 2022-02-04 MED ORDER — CARBINOXAMINE MALEATE 4 MG PO TABS: 4.0000 mg | ORAL_TABLET | Freq: Three times a day (TID) | ORAL | 5 refills | Status: DC

## 2022-02-19 ENCOUNTER — Ambulatory Visit (INDEPENDENT_AMBULATORY_CARE_PROVIDER_SITE_OTHER): Payer: 59

## 2022-02-19 DIAGNOSIS — J309 Allergic rhinitis, unspecified: Secondary | ICD-10-CM | POA: Diagnosis not present

## 2022-03-22 ENCOUNTER — Ambulatory Visit (INDEPENDENT_AMBULATORY_CARE_PROVIDER_SITE_OTHER): Payer: 59 | Admitting: *Deleted

## 2022-03-22 DIAGNOSIS — J309 Allergic rhinitis, unspecified: Secondary | ICD-10-CM | POA: Diagnosis not present

## 2022-03-26 DIAGNOSIS — H6011 Cellulitis of right external ear: Secondary | ICD-10-CM | POA: Diagnosis not present

## 2022-03-26 DIAGNOSIS — H60311 Diffuse otitis externa, right ear: Secondary | ICD-10-CM | POA: Diagnosis not present

## 2022-03-31 ENCOUNTER — Ambulatory Visit (INDEPENDENT_AMBULATORY_CARE_PROVIDER_SITE_OTHER): Payer: 59

## 2022-03-31 DIAGNOSIS — J309 Allergic rhinitis, unspecified: Secondary | ICD-10-CM | POA: Diagnosis not present

## 2022-03-31 DIAGNOSIS — H60311 Diffuse otitis externa, right ear: Secondary | ICD-10-CM | POA: Diagnosis not present

## 2022-04-05 ENCOUNTER — Ambulatory Visit (INDEPENDENT_AMBULATORY_CARE_PROVIDER_SITE_OTHER): Payer: 59

## 2022-04-05 DIAGNOSIS — J309 Allergic rhinitis, unspecified: Secondary | ICD-10-CM | POA: Diagnosis not present

## 2022-04-20 ENCOUNTER — Ambulatory Visit (INDEPENDENT_AMBULATORY_CARE_PROVIDER_SITE_OTHER): Payer: 59 | Admitting: *Deleted

## 2022-04-20 DIAGNOSIS — J309 Allergic rhinitis, unspecified: Secondary | ICD-10-CM

## 2022-04-26 ENCOUNTER — Ambulatory Visit (INDEPENDENT_AMBULATORY_CARE_PROVIDER_SITE_OTHER): Payer: 59 | Admitting: *Deleted

## 2022-04-26 DIAGNOSIS — J309 Allergic rhinitis, unspecified: Secondary | ICD-10-CM | POA: Diagnosis not present

## 2022-05-04 ENCOUNTER — Encounter: Payer: Self-pay | Admitting: Allergy & Immunology

## 2022-05-05 ENCOUNTER — Ambulatory Visit (INDEPENDENT_AMBULATORY_CARE_PROVIDER_SITE_OTHER): Payer: 59 | Admitting: *Deleted

## 2022-05-05 ENCOUNTER — Other Ambulatory Visit: Payer: Self-pay

## 2022-05-05 DIAGNOSIS — J309 Allergic rhinitis, unspecified: Secondary | ICD-10-CM | POA: Diagnosis not present

## 2022-05-05 MED ORDER — CARBINOXAMINE MALEATE 6 MG PO TABS: 1.0000 | ORAL_TABLET | Freq: Four times a day (QID) | ORAL | Status: DC | PRN

## 2022-05-10 DIAGNOSIS — N764 Abscess of vulva: Secondary | ICD-10-CM | POA: Diagnosis not present

## 2022-05-11 ENCOUNTER — Other Ambulatory Visit: Payer: Self-pay

## 2022-05-11 DIAGNOSIS — N764 Abscess of vulva: Secondary | ICD-10-CM | POA: Diagnosis not present

## 2022-05-11 MED ORDER — CARBINOXAMINE MALEATE 6 MG PO TABS: 1.0000 | ORAL_TABLET | Freq: Four times a day (QID) | ORAL | 1 refills | Status: DC | PRN

## 2022-05-14 ENCOUNTER — Ambulatory Visit (INDEPENDENT_AMBULATORY_CARE_PROVIDER_SITE_OTHER): Payer: 59

## 2022-05-14 DIAGNOSIS — J309 Allergic rhinitis, unspecified: Secondary | ICD-10-CM | POA: Diagnosis not present

## 2022-05-21 ENCOUNTER — Ambulatory Visit (INDEPENDENT_AMBULATORY_CARE_PROVIDER_SITE_OTHER): Payer: 59

## 2022-05-21 DIAGNOSIS — J309 Allergic rhinitis, unspecified: Secondary | ICD-10-CM | POA: Diagnosis not present

## 2022-05-27 ENCOUNTER — Ambulatory Visit (INDEPENDENT_AMBULATORY_CARE_PROVIDER_SITE_OTHER): Payer: 59 | Admitting: *Deleted

## 2022-05-27 DIAGNOSIS — J309 Allergic rhinitis, unspecified: Secondary | ICD-10-CM

## 2022-06-10 ENCOUNTER — Encounter: Payer: Self-pay | Admitting: Allergy & Immunology

## 2022-06-10 ENCOUNTER — Ambulatory Visit (INDEPENDENT_AMBULATORY_CARE_PROVIDER_SITE_OTHER): Payer: 59 | Admitting: Allergy & Immunology

## 2022-06-10 ENCOUNTER — Other Ambulatory Visit: Payer: Self-pay

## 2022-06-10 DIAGNOSIS — J309 Allergic rhinitis, unspecified: Secondary | ICD-10-CM

## 2022-06-10 DIAGNOSIS — J4530 Mild persistent asthma, uncomplicated: Secondary | ICD-10-CM | POA: Diagnosis not present

## 2022-06-10 NOTE — Progress Notes (Signed)
FOLLOW UP  Date of Service/Encounter:  06/10/22   Assessment:   Mild persistent asthma, uncomplicated - likely allergy triggered   Perennial and seasonal allergic rhinitis - on allergen immunotherapy with improved tolerance with use of epinephrine rinses   Adverse reaction to ant bites - not anaphylactic in nature    Plan/Recommendations:   1. Intermittent asthma, uncomplicated - Lung testing looks great today.  - We are not going to make any changes.  - Daily controller medication(s): NOTHING - Prior to physical activity: albuterol 2 puffs 10-15 minutes before physical activity. - Rescue medications: albuterol 4 puffs every 4-6 hours as needed - Changes during respiratory infections or worsening symptoms: Add on Symbicort 160/4.5 two puffs twice daily to 2 puffs twice daily for TWO WEEKS. - Asthma control goals:  * Full participation in all desired activities (may need albuterol before activity) * Albuterol use two time or less a week on average (not counting use with activity) * Cough interfering with sleep two time or less a month * Oral steroids no more than once a year * No hospitalizations  2. Seasonal and perennial allergic rhinitis - on allergen immunotherapy  - Come twice weekly to build up more quickly.  - Continue with epinephrine rinses as you are doing. - Continue with antihistamine on the day of your injections. - Continue with triamcinolone 0.5% ointment applied to your injection sites IMMEDIATELY after the shots.  3. Return in about 6 months (around 12/09/2022).      Subjective:   Stacy Tran is a 51 y.o. female presenting today for follow up of  Chief Complaint  Patient presents with   Asthma    No issues    Allergic Rhinitis     No issues    Stacy Tran has a history of the following: Patient Active Problem List   Diagnosis Date Noted   Allergic conjunctivitis 06/25/2019   Acid reflux 02/19/2019   Mild persistent asthma 01/08/2019    Seasonal and perennial allergic rhinitis 01/08/2019   Cough, persistent 01/08/2019    History obtained from: chart review and patient.  Stacy Tran is a 51 y.o. female presenting for a follow up visit.  She was last seen in May 2023.  At that time, we did not do lung testing.  We continue with albuterol as needed.  She had Symbicort that she added during flares.  For her rhinitis, we continued her on allergen immunotherapy.  We also continued with triamcinolone 0.5% ointment applied to your injection site immediately after the shots.  Since last visit, she has done very well. 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 She has no major vacations planned at all, although I am sure that something is going to come up.   Asthma/Respiratory Symptom History: Asthma has been under good control as long as she is on her allergy shots.  She has not been using her rescue inhaler much at all. She has the Symbicort to add when she is not feeling well, but otherwise she does not necessarily use anything routinely.   Allergic Rhinitis Symptom History: Allergic rhinitis is relatively controlled with the shots. She does use epi rinses as well as topical triamcinolone to cut down on the large local reactions.  She missed several weeks so she had to bump down. THis messed up her schedule and all of her symptoms.   Stacy Tran is on allergen immunotherapy. She receives two injections. Immunotherapy script #1  contains molds, cat, and dog. She currently receives 0.65mL of the GREEN vial (1/1,000). Immunotherapy script #2 contains trees, grasses, and dust mites. She currently receives 0.67mL of the GREEN vial (1/1,000). She started shots June of 2020 and not yet reached maintenance. The epi rinses are working well. She is still doing the topical steroid afterwards. This is allowing her to get the increasing doses.    She was very busy over the past year. She has gone non step since last summer. She  usually takes off fall because of all of the family birthdays and whatnot.   Otherwise, there have been no changes to her past medical history, surgical history, family history, or social history.    Review of Systems  Constitutional: Negative.  Negative for chills, fever, malaise/fatigue and weight loss.  HENT: Negative.  Negative for congestion, ear discharge and ear pain.   Eyes:  Negative for pain, discharge and redness.  Respiratory:  Negative for cough, sputum production, shortness of breath and wheezing.   Cardiovascular: Negative.  Negative for chest pain and palpitations.  Gastrointestinal:  Negative for abdominal pain, constipation, diarrhea, heartburn, nausea and vomiting.  Skin: Negative.  Negative for itching and rash.  Neurological:  Negative for dizziness and headaches.  Endo/Heme/Allergies:  Negative for environmental allergies. Does not bruise/bleed easily.       Objective:   Vitals reviewed and all within normal limits.     Physical Exam Vitals reviewed.  Constitutional:      Appearance: She is well-developed.     Comments: Very lovely.   HENT:     Head: Normocephalic and atraumatic.     Right Ear: Tympanic membrane, ear canal and external ear normal.     Left Ear: Tympanic membrane, ear canal and external ear normal.     Nose: Rhinorrhea present. No nasal deformity, septal deviation or mucosal edema.     Right Turbinates: Enlarged, swollen and pale.     Left Turbinates: Enlarged, swollen and pale.     Right Sinus: No maxillary sinus tenderness or frontal sinus tenderness.     Left Sinus: No maxillary sinus tenderness or frontal sinus tenderness.     Comments: No nasal polyps noted.     Mouth/Throat:     Lips: Pink.     Mouth: Mucous membranes are moist. Mucous membranes are not pale and not dry.     Pharynx: Uvula midline.     Comments: Cobblestoning in the posterior oropharynx.  Eyes:     General: Lids are normal. Allergic shiner present.         Right eye: No discharge.        Left eye: No discharge.     Conjunctiva/sclera: Conjunctivae normal.     Right eye: Right conjunctiva is not injected. No chemosis.    Left eye: Left conjunctiva is not injected. No chemosis.    Pupils: Pupils are equal, round, and reactive to light.  Cardiovascular:     Rate and Rhythm: Normal rate and regular rhythm.     Heart sounds: Normal heart sounds.  Pulmonary:     Effort: Pulmonary effort is normal. No tachypnea, accessory muscle usage or respiratory distress.     Breath sounds: Normal breath sounds. No wheezing, rhonchi or rales.     Comments: Moving air well in all lung fields. No increased work of breathing noted. Chest:     Chest wall: No tenderness.  Lymphadenopathy:     Cervical: No cervical adenopathy.  Skin:  General: Skin is warm.     Capillary Refill: Capillary refill takes less than 2 seconds.     Coloration: Skin is not pale.     Findings: No abrasion, erythema, petechiae or rash. Rash is not papular, urticarial or vesicular.     Comments: She does have swollen ankles bilaterally secondary to recent ant bites. No vesicles at this point.   Neurological:     Mental Status: She is alert.  Psychiatric:        Behavior: Behavior is cooperative.      Diagnostic studies:    Spirometry: results normal (FEV1: 2.42/94%, FVC: 2.81/patient 86%, FEV1/FVC: 86%).    Spirometry consistent with normal pattern.    Allergy Studies: none          Salvatore Marvel, MD  [facility]

## 2022-06-10 NOTE — Patient Instructions (Addendum)
1. Intermittent asthma, uncomplicated - Lung testing looks great today.  - We are not going to make any changes.  - Daily controller medication(s): NOTHING - Prior to physical activity: albuterol 2 puffs 10-15 minutes before physical activity. - Rescue medications: albuterol 4 puffs every 4-6 hours as needed - Changes during respiratory infections or worsening symptoms: Add on Symbicort 160/4.5 two puffs twice daily to 2 puffs twice daily for TWO WEEKS. - Asthma control goals:  * Full participation in all desired activities (may need albuterol before activity) * Albuterol use two time or less a week on average (not counting use with activity) * Cough interfering with sleep two time or less a month * Oral steroids no more than once a year * No hospitalizations  2. Seasonal and perennial allergic rhinitis - on allergen immunotherapy  - Come twice weekly to build up more quickly.  - Continue with epinephrine rinses as you are doing. - Continue with antihistamine on the day of your injections. - Continue with triamcinolone 0.5% ointment applied to your injection sites IMMEDIATELY after the shots.  3. Return in about 6 months (around 12/09/2022).    Please inform us of any Emergency Department visits, hospitalizations, or changes in symptoms. Call us before going to the ED for breathing or allergy symptoms since we might be able to fit you in for a sick visit. Feel free to contact us anytime with any questions, problems, or concerns.  It was a pleasure to see you again today!  Websites that have reliable patient information: 1. American Academy of Asthma, Allergy, and Immunology: www.aaaai.org 2. Food Allergy Research and Education (FARE): foodallergy.org 3. Mothers of Asthmatics: http://www.asthmacommunitynetwork.org 4. American College of Allergy, Asthma, and Immunology: www.acaai.org   COVID-19 Vaccine Information can be found at:  [email or web address].co.uk For questions related to vaccine distribution or appointments, please email [email or web address] or call [phone].   We realize that you might be concerned about having an allergic reaction to the COVID19 vaccines. To help with that concern, WE ARE OFFERING THE COVID19 VACCINES IN OUR OFFICE! Ask the front desk for dates!     "Like" [place] on Facebook and Instagram for our latest updates!      A healthy democracy works best when [employer] participate! Make sure you are registered to vote! If you have moved or changed any of your contact information, you will need to get this updated before voting!  In some cases, you MAY be able to register to vote online: [email or web address]

## 2022-06-14 ENCOUNTER — Encounter: Payer: Self-pay | Admitting: Allergy & Immunology

## 2022-06-16 ENCOUNTER — Ambulatory Visit (INDEPENDENT_AMBULATORY_CARE_PROVIDER_SITE_OTHER): Payer: 59 | Admitting: *Deleted

## 2022-06-16 DIAGNOSIS — J309 Allergic rhinitis, unspecified: Secondary | ICD-10-CM

## 2022-06-22 ENCOUNTER — Ambulatory Visit (INDEPENDENT_AMBULATORY_CARE_PROVIDER_SITE_OTHER): Payer: 59

## 2022-06-22 DIAGNOSIS — J309 Allergic rhinitis, unspecified: Secondary | ICD-10-CM

## 2022-06-25 ENCOUNTER — Ambulatory Visit (INDEPENDENT_AMBULATORY_CARE_PROVIDER_SITE_OTHER): Payer: 59

## 2022-06-25 DIAGNOSIS — J309 Allergic rhinitis, unspecified: Secondary | ICD-10-CM | POA: Diagnosis not present

## 2022-06-30 ENCOUNTER — Ambulatory Visit (INDEPENDENT_AMBULATORY_CARE_PROVIDER_SITE_OTHER): Payer: 59 | Admitting: *Deleted

## 2022-06-30 DIAGNOSIS — J309 Allergic rhinitis, unspecified: Secondary | ICD-10-CM | POA: Diagnosis not present

## 2022-07-05 ENCOUNTER — Ambulatory Visit (INDEPENDENT_AMBULATORY_CARE_PROVIDER_SITE_OTHER): Payer: 59

## 2022-07-05 DIAGNOSIS — J309 Allergic rhinitis, unspecified: Secondary | ICD-10-CM | POA: Diagnosis not present

## 2022-07-15 ENCOUNTER — Ambulatory Visit (INDEPENDENT_AMBULATORY_CARE_PROVIDER_SITE_OTHER): Payer: 59

## 2022-07-15 DIAGNOSIS — J309 Allergic rhinitis, unspecified: Secondary | ICD-10-CM | POA: Diagnosis not present

## 2022-07-23 ENCOUNTER — Ambulatory Visit (INDEPENDENT_AMBULATORY_CARE_PROVIDER_SITE_OTHER): Payer: 59

## 2022-07-23 DIAGNOSIS — J309 Allergic rhinitis, unspecified: Secondary | ICD-10-CM

## 2022-07-27 ENCOUNTER — Ambulatory Visit (INDEPENDENT_AMBULATORY_CARE_PROVIDER_SITE_OTHER): Payer: 59

## 2022-07-27 DIAGNOSIS — J309 Allergic rhinitis, unspecified: Secondary | ICD-10-CM

## 2022-07-31 ENCOUNTER — Other Ambulatory Visit: Payer: Self-pay | Admitting: Allergy & Immunology

## 2022-08-03 ENCOUNTER — Encounter: Payer: Self-pay | Admitting: Allergy & Immunology

## 2022-08-04 ENCOUNTER — Other Ambulatory Visit: Payer: Self-pay | Admitting: *Deleted

## 2022-08-04 MED ORDER — CARBINOXAMINE MALEATE 4 MG PO TABS: 4.0000 mg | ORAL_TABLET | Freq: Three times a day (TID) | ORAL | 5 refills | Status: DC

## 2022-08-05 ENCOUNTER — Ambulatory Visit (INDEPENDENT_AMBULATORY_CARE_PROVIDER_SITE_OTHER): Payer: 59

## 2022-08-05 DIAGNOSIS — J309 Allergic rhinitis, unspecified: Secondary | ICD-10-CM

## 2022-08-06 DIAGNOSIS — N390 Urinary tract infection, site not specified: Secondary | ICD-10-CM | POA: Diagnosis not present

## 2022-08-18 ENCOUNTER — Ambulatory Visit (INDEPENDENT_AMBULATORY_CARE_PROVIDER_SITE_OTHER): Payer: 59

## 2022-08-18 DIAGNOSIS — J309 Allergic rhinitis, unspecified: Secondary | ICD-10-CM

## 2022-09-08 ENCOUNTER — Ambulatory Visit (INDEPENDENT_AMBULATORY_CARE_PROVIDER_SITE_OTHER): Payer: 59

## 2022-09-08 DIAGNOSIS — J309 Allergic rhinitis, unspecified: Secondary | ICD-10-CM

## 2022-09-14 ENCOUNTER — Ambulatory Visit (INDEPENDENT_AMBULATORY_CARE_PROVIDER_SITE_OTHER): Payer: Self-pay

## 2022-09-14 DIAGNOSIS — J309 Allergic rhinitis, unspecified: Secondary | ICD-10-CM

## 2022-09-21 ENCOUNTER — Encounter: Payer: Self-pay | Admitting: Allergy & Immunology

## 2022-09-21 ENCOUNTER — Other Ambulatory Visit: Payer: Self-pay

## 2022-09-21 ENCOUNTER — Ambulatory Visit (INDEPENDENT_AMBULATORY_CARE_PROVIDER_SITE_OTHER): Payer: 59 | Admitting: Allergy & Immunology

## 2022-09-21 VITALS — BP 142/82 | HR 86 | Temp 98.4°F | Resp 14 | Ht 62.0 in | Wt 271.1 lb

## 2022-09-21 DIAGNOSIS — J4530 Mild persistent asthma, uncomplicated: Secondary | ICD-10-CM | POA: Diagnosis not present

## 2022-09-21 DIAGNOSIS — K219 Gastro-esophageal reflux disease without esophagitis: Secondary | ICD-10-CM

## 2022-09-21 DIAGNOSIS — J3089 Other allergic rhinitis: Secondary | ICD-10-CM

## 2022-09-21 MED ORDER — MONTELUKAST SODIUM 10 MG PO TABS: 10.0000 mg | ORAL_TABLET | Freq: Every day | ORAL | 2 refills | Status: DC

## 2022-09-21 MED ORDER — CARBINOXAMINE MALEATE 4 MG PO TABS: 4.0000 mg | ORAL_TABLET | Freq: Four times a day (QID) | ORAL | 2 refills | Status: DC

## 2022-09-21 NOTE — Progress Notes (Unsigned)
FOLLOW UP  Date of Service/Encounter:  09/21/22   Assessment:   Mild persistent asthma, uncomplicated - likely allergy triggered   Perennial and seasonal allergic rhinitis - on allergen immunotherapy with improved tolerance with use of epinephrine rinses   Adverse reaction to ant bites - not anaphylactic in nature    Plan/Recommendations:    There are no Patient Instructions on file for this visit.   Subjective:   Stacy Tran is a 52 y.o. female presenting today for follow up of No chief complaint on file.   Stacy Tran has a history of the following: Patient Active Problem List   Diagnosis Date Noted   Allergic conjunctivitis 06/25/2019   Acid reflux 02/19/2019   Mild persistent asthma 01/08/2019   Seasonal and perennial allergic rhinitis 01/08/2019   Cough, persistent 01/08/2019    History obtained from: chart review and {Persons; PED relatives w/patient:19415::"patient"}.  Stacy Tran is a 52 y.o. female presenting for {Blank single:19197::"a food challenge","a drug challenge","skin testing","a sick visit","an evaluation of ***","a follow up visit"}.  She was last seen in November 2023.  At that time, her lung testing looked great.  We continue with albuterol as needed with Symbicort added during flares.  For her allergic rhinitis, we continue with an antihistamine and triamcinolone.  Since the last visit, she has had worsening symptoms since December 2023. She has to take the carbinoxamine 95m every 4 hours to keep up with the symptoms. She is doubling sometimes. She was on cetirizine previously and this was not doing anything. She was never on Allegra. She reports that the carbinoxamine is somewhat affordable. The 626mwas done initially and it was making her sleepy at night.   Main issue is the wheezing and coughing. She is not using the Symbicort. She has dry mouth and issues with that when she was using it more routinely. She reports that she has oral  dryness from the steroids and irritation. She has never needed nystatin for thrush.   {Blank single:19197::"Asthma/Respiratory Symptom History: ***"," "}  {Blank single:19197::"Allergic Rhinitis Symptom History: ***"," "}  She has been moving through the green and hit a major reaction. She has never made it to the [facility]  She doesn ot have a PCP.  {Blank single:19197::"Food Allergy Symptom History: ***"," "}  {Blank single:19197::"Skin Symptom History: ***"," "}  {Blank single:19197::"GERD Symptom History: ***"," "}  Otherwise, there have been no changes to her past medical history, surgical history, family history, or social history.    ROS     Objective:   There were no vitals taken for this visit. There is no height or weight on file to calculate BMI.    Physical Exam   Diagnostic studies:    Spirometry: results normal (FEV1: 2.54/98%, FVC: 3.11/95%, FEV1/FVC: 82%).    Spirometry consistent with normal pattern. {Blank single:19197::"Albuterol/Atrovent nebulizer","Xopenex/Atrovent nebulizer","Albuterol nebulizer","Albuterol four puffs via MDI","Xopenex four puffs via MDI"} treatment given in clinic with {Blank single:19197::"significant improvement in FEV1 per ATS criteria","significant improvement in FVC per ATS criteria","significant improvement in FEV1 and FVC per ATS criteria","improvement in FEV1, but not significant per ATS criteria","improvement in FVC, but not significant per ATS criteria","improvement in FEV1 and FVC, but not significant per ATS criteria","no improvement"}.  Allergy Studies: {Blank single:19197::"none","labs sent instead"," "}    {Blank single:19197::"Allergy testing results were read and interpreted by myself, documented by clinical staff."," "}      JoSalvatore MarvelMD  [facility]

## 2022-09-21 NOTE — Patient Instructions (Addendum)
1. Intermittent asthma, uncomplicated - Lung testing looks great today.  - We are adding Singulair 31m daily.  - If the Singulair does not help, I would do Symbicort at least once daily.  - Daily controller medication(s): Singulair 112mday - Prior to physical activity: albuterol 2 puffs 10-15 minutes before physical activity. - Rescue medications: albuterol 4 puffs every 4-6 hours as needed - Changes during respiratory infections or worsening symptoms: Add on Symbicort 160/4.5 two puffs twice daily to 2 puffs twice daily for TWO WEEKS. - Asthma control goals:  * Full participation in all desired activities (may need albuterol before activity) * Albuterol use two time or less a week on average (not counting use with activity) * Cough interfering with sleep two time or less a month * Oral steroids no more than once a year * No hospitalizations  2. Seasonal and perennial allergic rhinitis - on allergen immunotherapy  - Add on Singulair 1053maily (this can help with the ASTHMA and the ALLERGIES). - Continue with carbinoxamine every 4 hours as needed (AND DEF on shot days). - We are going to REPEAT each dose and then move upwards after that. - Hopefully we can increase to the Red Vial.  - Continue with epinephrine rinses as you are doing. - Continue with antihistamine on the day of your injections. - Continue with triamcinolone 0.5% ointment applied to your injection sites IMMEDIATELY after the shots.  3. Return in about 3 months (around 12/20/2022).    Please inform us [place] any Emergency Department visits, hospitalizations, or changes in symptoms. Call us [place] going to the ED for breathing or allergy symptoms since we might be able to fit you in for a sick visit. Feel free to contact us [place] with any questions, problems, or concerns.  It was a pleasure to see you again today!  Websites that have reliable patient information: 1. American Academy of Asthma, Allergy, and Immunology:  www.aaaai.org 2. Food Allergy Research and Education (FARE): foodallergy.org 3. Mothers of Asthmatics: http://www.asthmacommunitynetwork.org 4. American College of Allergy, Asthma, and Immunology: www.acaai.org   COVID-19 Vaccine Information can be found at: [email or web address] questions related to vaccine distribution or appointments, please email [email or web address] or call [phone] We realize that you might be concerned about having an allergic reaction to the COVID19 vaccines. To help with that concern, WE ARE OFFERING THE COVID19 VACCINES IN OUR OFFICE! Ask the front desk for dates!     "Like" us [place] Facebook and Instagram for our latest updates!      A healthy democracy works best when ALLNew [place] Life Insurancerticipate! Make sure you are registered to vote! If you have moved or changed any of your contact information, you will need to get this updated before voting!  In some cases, you MAY be able to register to vote online: [email or web address]

## 2022-09-22 MED ORDER — SYMBICORT 160-4.5 MCG/ACT IN AERO: INHALATION_SPRAY | RESPIRATORY_TRACT | 5 refills | Status: DC

## 2022-09-22 MED ORDER — ALBUTEROL SULFATE HFA 108 (90 BASE) MCG/ACT IN AERS: 2.0000 | INHALATION_SPRAY | RESPIRATORY_TRACT | 1 refills | Status: DC | PRN

## 2022-09-23 ENCOUNTER — Ambulatory Visit (INDEPENDENT_AMBULATORY_CARE_PROVIDER_SITE_OTHER): Payer: 59

## 2022-09-23 DIAGNOSIS — J309 Allergic rhinitis, unspecified: Secondary | ICD-10-CM | POA: Diagnosis not present

## 2022-10-08 ENCOUNTER — Ambulatory Visit (INDEPENDENT_AMBULATORY_CARE_PROVIDER_SITE_OTHER): Payer: 59 | Admitting: *Deleted

## 2022-10-08 DIAGNOSIS — J309 Allergic rhinitis, unspecified: Secondary | ICD-10-CM | POA: Diagnosis not present

## 2022-10-11 DIAGNOSIS — J3089 Other allergic rhinitis: Secondary | ICD-10-CM | POA: Diagnosis not present

## 2022-10-11 NOTE — Progress Notes (Signed)
VIALS EXP 10-11-23

## 2022-11-04 ENCOUNTER — Ambulatory Visit (INDEPENDENT_AMBULATORY_CARE_PROVIDER_SITE_OTHER): Payer: 59

## 2022-11-04 DIAGNOSIS — J309 Allergic rhinitis, unspecified: Secondary | ICD-10-CM

## 2022-11-12 ENCOUNTER — Ambulatory Visit (INDEPENDENT_AMBULATORY_CARE_PROVIDER_SITE_OTHER): Payer: 59

## 2022-11-12 DIAGNOSIS — J309 Allergic rhinitis, unspecified: Secondary | ICD-10-CM | POA: Diagnosis not present

## 2022-11-15 ENCOUNTER — Other Ambulatory Visit: Payer: Self-pay | Admitting: Obstetrics & Gynecology

## 2022-11-15 DIAGNOSIS — R928 Other abnormal and inconclusive findings on diagnostic imaging of breast: Secondary | ICD-10-CM

## 2022-11-23 ENCOUNTER — Ambulatory Visit (INDEPENDENT_AMBULATORY_CARE_PROVIDER_SITE_OTHER): Payer: 59

## 2022-11-23 DIAGNOSIS — J309 Allergic rhinitis, unspecified: Secondary | ICD-10-CM

## 2022-11-26 ENCOUNTER — Other Ambulatory Visit (HOSPITAL_COMMUNITY): Payer: Self-pay

## 2022-11-26 MED ORDER — WEGOVY 0.25 MG/0.5ML ~~LOC~~ SOAJ
0.2500 mg | SUBCUTANEOUS | 0 refills | Status: DC
  Filled 2022-11-26: qty 2, 28d supply, fill #0

## 2022-12-02 ENCOUNTER — Ambulatory Visit
Admission: RE | Admit: 2022-12-02 | Discharge: 2022-12-02 | Disposition: A | Discharge status: Home / Self Care | Payer: Self-pay | Source: Ambulatory Visit | Attending: Obstetrics & Gynecology | Admitting: Obstetrics & Gynecology

## 2022-12-02 ENCOUNTER — Other Ambulatory Visit (HOSPITAL_COMMUNITY): Payer: Self-pay

## 2022-12-02 ENCOUNTER — Ambulatory Visit (INDEPENDENT_AMBULATORY_CARE_PROVIDER_SITE_OTHER): Payer: 59

## 2022-12-02 DIAGNOSIS — J309 Allergic rhinitis, unspecified: Secondary | ICD-10-CM | POA: Diagnosis not present

## 2022-12-02 DIAGNOSIS — R928 Other abnormal and inconclusive findings on diagnostic imaging of breast: Secondary | ICD-10-CM

## 2022-12-04 ENCOUNTER — Encounter: Payer: Self-pay | Admitting: Allergy & Immunology

## 2022-12-23 ENCOUNTER — Ambulatory Visit: Payer: 59 | Admitting: Allergy & Immunology

## 2022-12-23 DIAGNOSIS — J309 Allergic rhinitis, unspecified: Secondary | ICD-10-CM

## 2022-12-28 ENCOUNTER — Ambulatory Visit: Payer: Self-pay

## 2022-12-28 ENCOUNTER — Ambulatory Visit (INDEPENDENT_AMBULATORY_CARE_PROVIDER_SITE_OTHER): Payer: 59 | Admitting: Allergy & Immunology

## 2022-12-28 VITALS — BP 140/86 | HR 79 | Temp 98.0°F | Resp 18 | Ht 62.0 in | Wt 265.8 lb

## 2022-12-28 DIAGNOSIS — J309 Allergic rhinitis, unspecified: Secondary | ICD-10-CM

## 2022-12-28 DIAGNOSIS — J4530 Mild persistent asthma, uncomplicated: Secondary | ICD-10-CM

## 2022-12-28 DIAGNOSIS — K219 Gastro-esophageal reflux disease without esophagitis: Secondary | ICD-10-CM

## 2022-12-28 DIAGNOSIS — J3089 Other allergic rhinitis: Secondary | ICD-10-CM

## 2022-12-28 MED ORDER — EPINEPHRINE 0.3 MG/0.3ML IJ SOAJ: 0.3000 mg | INTRAMUSCULAR | 1 refills | Status: AC | PRN

## 2022-12-28 MED ORDER — MONTELUKAST SODIUM 10 MG PO TABS: 10.0000 mg | ORAL_TABLET | Freq: Every day | ORAL | 2 refills | Status: AC

## 2022-12-28 MED ORDER — PAZEO 0.7 % OP SOLN: 1.0000 [drp] | Freq: Every day | OPHTHALMIC | 5 refills | Status: AC | PRN

## 2022-12-28 NOTE — Progress Notes (Unsigned)
FOLLOW UP  Date of Service/Encounter:  12/28/22   Assessment:   Mild persistent asthma, uncomplicated - likely allergy triggered   Perennial and seasonal allergic rhinitis - on allergen immunotherapy with improved tolerance with use of epinephrine rinses, but still has not reached maintenance   Adverse reaction to ant bites - not anaphylactic in nature  Plan/Recommendations:   1. Intermittent asthma, uncomplicated - Lung testing looks great today.  - Daily controller medication(s): NOTHING - Prior to physical activity: albuterol 2 puffs 10-15 minutes before physical activity. - Rescue medications: albuterol 4 puffs every 4-6 hours as needed - Changes during respiratory infections or worsening symptoms: Add on Symbicort 160/4.5 two puffs twice daily to 2 puffs twice daily for TWO WEEKS. - Asthma control goals:  * Full participation in all desired activities (may need albuterol before activity) * Albuterol use two time or less a week on average (not counting use with activity) * Cough interfering with sleep two time or less a month * Oral steroids no more than once a year * No hospitalizations  2. Seasonal and perennial allergic rhinitis - on allergen immunotherapy  - Consider switching antihistamines every 3-6 months.  - Continue with carbinoxamine every once daily (can increase to 3-4 times daily if needed).  - Continue with epinephrine rinses as you are doing. - Continue with antihistamine on the day of your injections. - Continue with triamcinolone 0.5% ointment applied to your injection sites IMMEDIATELY after the shots.  3. Return in about 6 months (around 06/30/2023).   Subjective:   Stacy Tran is a 52 y.o. female presenting today for follow up of  Chief Complaint  Patient presents with   Asthma   Allergic Rhinitis     Claritin helps     Stacy Tran has a history of the following: Patient Active Problem List   Diagnosis Date Noted   Allergic  conjunctivitis 06/25/2019   Acid reflux 02/19/2019   Mild persistent asthma 01/08/2019   Seasonal and perennial allergic rhinitis 01/08/2019   Cough, persistent 01/08/2019    History obtained from: chart review and patient.  Stacy Tran is a 52 y.o. female presenting for a follow up visit.  She was last seen in February 2024.  At that time, she was having worsening symptoms despite the use of allergen immunotherapy and allergy medications.  However, she was not very compliant with the medication regimen.  Her lung testing looked great.  We added on Singulair 10 mg daily with Symbicort added if there was no improvement after that.  For her rhinitis, we continue with carbinoxamine every 4 hours as needed.  We decided to repeat each allergy shot dose before advancing.  She was also doing her epinephrine rinses in combination with triamcinolone applied immediately after her shots..  Since last visit, she has done very well.   Asthma/Respiratory Symptom History: She did use her Symbicort during that time frame of the last visit for around one week. But around one week of the last visit, she was much better.  She remains on the Symbicort on a PRN basis. She has not needed this in quite some time. She has not been on prednisone for her symptoms and her coughing and wheezing have been kept to a minimum. She has not been on prednisone for her symptoms at all.   Allergic Rhinitis Symptom History: She is taking the carbinoxamine more lately. She is on the fexofenadine and this seems to be helping. She has not been  taking the montelukast recently. She was having the "issues" with her breathing but she stopped taking it when it calmed down. She only took it for a week or so.  She is back to normal now and feels very good.  Her allergy shots are going fine, although she continues to be less than compliant with them.  Her last injection was around a month ago.   Stacy Tran is on allergen immunotherapy. She receives  two injections. Immunotherapy script #1 contains molds, cat, and dog. She currently receives 0.74mL of the GREEN vial (1/1,000). Immunotherapy script #2 contains trees, grasses, and dust mites. She currently receives 0.25mL of the GREEN vial (1/1,000). She started shots June of 2020 and not yet reached maintenance. The epi rinses are working well. She is still doing the topical steroid afterwards.   Her current project is [employer]. The event is taking place on June 22 at the [facility].  The event after the is the boat festival.  That is a big event of the year.  Otherwise, there have been no changes to her past medical history, surgical history, family history, or social history.    Review of Systems  Constitutional: Negative.  Negative for chills, fever, malaise/fatigue and weight loss.  HENT:  Negative for congestion, ear discharge, ear pain and sinus pain.   Eyes:  Negative for pain, discharge and redness.  Respiratory:  Negative for cough, sputum production, shortness of breath and wheezing.   Cardiovascular: Negative.  Negative for chest pain and palpitations.  Gastrointestinal:  Negative for abdominal pain, constipation, diarrhea, heartburn, nausea and vomiting.  Skin: Negative.  Negative for itching and rash.  Neurological:  Negative for dizziness and headaches.  Endo/Heme/Allergies:  Positive for environmental allergies. Does not bruise/bleed easily.       Objective:   Blood pressure (!) 140/86, pulse 79, temperature 98 F (36.7 C), resp. rate 18, height 5\' 2"  (1.575 m), weight 265 lb 12.8 oz (120.6 kg), SpO2 96 %. Body mass index is 48.62 kg/m.    Physical Exam Vitals reviewed.  Constitutional:      Appearance: She is well-developed.     Comments: Very lovely.  Talkative. Looks much better than the last time that I saw her.   HENT:     Head: Normocephalic and atraumatic.     Right Ear: Tympanic membrane, ear canal and external ear normal.      Left Ear: Tympanic membrane, ear canal and external ear normal.     Nose: Rhinorrhea present. No nasal deformity, septal deviation or mucosal edema.     Right Turbinates: Enlarged, swollen and pale.     Left Turbinates: Enlarged, swollen and pale.     Right Sinus: No maxillary sinus tenderness or frontal sinus tenderness.     Left Sinus: No maxillary sinus tenderness or frontal sinus tenderness.     Comments: No nasal polyps noted.     Mouth/Throat:     Lips: Pink.     Mouth: Mucous membranes are moist. Mucous membranes are not pale and not dry.     Pharynx: Uvula midline.     Comments: Cobblestoning in the posterior oropharynx.  Eyes:     General: Lids are normal. Allergic shiner present.        Right eye: No discharge.        Left eye: No discharge.     Conjunctiva/sclera: Conjunctivae normal.     Right eye: Right conjunctiva is not injected. No chemosis.  Left eye: Left conjunctiva is not injected. No chemosis.    Pupils: Pupils are equal, round, and reactive to light.  Cardiovascular:     Rate and Rhythm: Normal rate and regular rhythm.     Heart sounds: Normal heart sounds.  Pulmonary:     Effort: Pulmonary effort is normal. No tachypnea, accessory muscle usage or respiratory distress.     Breath sounds: Normal breath sounds. No wheezing, rhonchi or rales.     Comments: Moving air well in all lung fields. No increased work of breathing noted. Chest:     Chest wall: No tenderness.  Lymphadenopathy:     Cervical: No cervical adenopathy.  Skin:    General: Skin is warm.     Capillary Refill: Capillary refill takes less than 2 seconds.     Coloration: Skin is not pale.     Findings: No abrasion, erythema, petechiae or rash. Rash is not papular, urticarial or vesicular.     Comments: She does have swollen ankles bilaterally secondary to recent ant bites. No vesicles at this point.   Neurological:     Mental Status: She is alert.  Psychiatric:        Behavior: Behavior is  cooperative.      Diagnostic studies:    Spirometry: results normal (FEV1: 2.60/108%, FVC: 3.16/107%, FEV1/FVC: 82%).    Spirometry consistent with normal pattern.   Allergy Studies: none        Malachi Bonds, MD  [facility]

## 2022-12-28 NOTE — Patient Instructions (Addendum)
1. Intermittent asthma, uncomplicated - Lung testing looks great today.  - Daily controller medication(s): NOTHING - Prior to physical activity: albuterol 2 puffs 10-15 minutes before physical activity. - Rescue medications: albuterol 4 puffs every 4-6 hours as needed - Changes during respiratory infections or worsening symptoms: Add on Symbicort 160/4.5 two puffs twice daily to 2 puffs twice daily for TWO WEEKS. - Asthma control goals:  * Full participation in all desired activities (may need albuterol before activity) * Albuterol use two time or less a week on average (not counting use with activity) * Cough interfering with sleep two time or less a month * Oral steroids no more than once a year * No hospitalizations  2. Seasonal and perennial allergic rhinitis - on allergen immunotherapy  - Consider switching antihistamines every 3-6 months.  - Continue with carbinoxamine every once daily (can increase to 3-4 times daily if needed).  - Continue with epinephrine rinses as you are doing. - Continue with antihistamine on the day of your injections. - Continue with triamcinolone 0.5% ointment applied to your injection sites IMMEDIATELY after the shots.  3. Return in about 6 months (around 06/30/2023).    Please inform us of any Emergency Department visits, hospitalizations, or changes in symptoms. Call us before going to the ED for breathing or allergy symptoms since we might be able to fit you in for a sick visit. Feel free to contact us anytime with any questions, problems, or concerns.  It was a pleasure to see you again today!  Websites that have reliable patient information: 1. American Academy of Asthma, Allergy, and Immunology: www.aaaai.org 2. Food Allergy Research and Education (FARE): foodallergy.org 3. Mothers of Asthmatics: http://www.asthmacommunitynetwork.org 4. American College of Allergy, Asthma, and Immunology: www.acaai.org   COVID-19 Vaccine Information can be  found at: [email or web address].[email or web address] For questions related to vaccine distribution or appointments, please email [email or web address] or call [phone].   We realize that you might be concerned about having an allergic reaction to the COVID19 vaccines. To help with that concern, WE ARE OFFERING THE COVID19 VACCINES IN OUR OFFICE! Ask the front desk for dates!     "Like" [place] on Facebook and Instagram for our latest updates!      A healthy democracy works best when [employer] participate! Make sure you are registered to vote! If you have moved or changed any of your contact information, you will need to get this updated before voting!  In some cases, you MAY be able to register to vote online: [email or web address]

## 2022-12-29 ENCOUNTER — Other Ambulatory Visit (HOSPITAL_COMMUNITY): Payer: Self-pay

## 2022-12-30 ENCOUNTER — Encounter: Payer: Self-pay | Admitting: Allergy & Immunology

## 2022-12-31 ENCOUNTER — Other Ambulatory Visit (HOSPITAL_COMMUNITY): Payer: Self-pay

## 2022-12-31 MED ORDER — WEGOVY 0.5 MG/0.5ML ~~LOC~~ SOAJ
0.5000 mg | SUBCUTANEOUS | 0 refills | Status: DC
  Filled 2022-12-31: qty 2, 28d supply, fill #0

## 2023-01-05 ENCOUNTER — Ambulatory Visit (INDEPENDENT_AMBULATORY_CARE_PROVIDER_SITE_OTHER): Payer: 59 | Admitting: *Deleted

## 2023-01-05 DIAGNOSIS — J309 Allergic rhinitis, unspecified: Secondary | ICD-10-CM | POA: Diagnosis not present

## 2023-01-11 ENCOUNTER — Ambulatory Visit (INDEPENDENT_AMBULATORY_CARE_PROVIDER_SITE_OTHER): Payer: 59 | Admitting: *Deleted

## 2023-01-11 DIAGNOSIS — J309 Allergic rhinitis, unspecified: Secondary | ICD-10-CM | POA: Diagnosis not present

## 2023-01-20 ENCOUNTER — Ambulatory Visit (INDEPENDENT_AMBULATORY_CARE_PROVIDER_SITE_OTHER): Payer: 59

## 2023-01-20 DIAGNOSIS — J309 Allergic rhinitis, unspecified: Secondary | ICD-10-CM

## 2023-01-26 ENCOUNTER — Other Ambulatory Visit (HOSPITAL_COMMUNITY): Payer: Self-pay

## 2023-01-26 MED ORDER — WEGOVY 1 MG/0.5ML ~~LOC~~ SOAJ
SUBCUTANEOUS | 0 refills | Status: DC
  Filled 2023-01-26: qty 2, 28d supply, fill #0

## 2023-01-27 ENCOUNTER — Other Ambulatory Visit (HOSPITAL_COMMUNITY): Payer: Self-pay

## 2023-01-27 ENCOUNTER — Ambulatory Visit (INDEPENDENT_AMBULATORY_CARE_PROVIDER_SITE_OTHER): Payer: 59

## 2023-01-27 DIAGNOSIS — J309 Allergic rhinitis, unspecified: Secondary | ICD-10-CM | POA: Diagnosis not present

## 2023-02-01 ENCOUNTER — Ambulatory Visit (INDEPENDENT_AMBULATORY_CARE_PROVIDER_SITE_OTHER): Payer: 59 | Admitting: *Deleted

## 2023-02-01 DIAGNOSIS — J309 Allergic rhinitis, unspecified: Secondary | ICD-10-CM | POA: Diagnosis not present

## 2023-02-17 ENCOUNTER — Ambulatory Visit (INDEPENDENT_AMBULATORY_CARE_PROVIDER_SITE_OTHER): Payer: 59 | Admitting: *Deleted

## 2023-02-17 DIAGNOSIS — J309 Allergic rhinitis, unspecified: Secondary | ICD-10-CM

## 2023-02-21 ENCOUNTER — Ambulatory Visit (INDEPENDENT_AMBULATORY_CARE_PROVIDER_SITE_OTHER): Payer: 59 | Admitting: *Deleted

## 2023-02-21 DIAGNOSIS — J309 Allergic rhinitis, unspecified: Secondary | ICD-10-CM

## 2023-02-25 ENCOUNTER — Other Ambulatory Visit (HOSPITAL_COMMUNITY): Payer: Self-pay

## 2023-02-28 ENCOUNTER — Other Ambulatory Visit (HOSPITAL_COMMUNITY): Payer: Self-pay

## 2023-02-28 MED ORDER — WEGOVY 1.7 MG/0.75ML ~~LOC~~ SOAJ
1.7000 mg | SUBCUTANEOUS | 0 refills | Status: DC
  Filled 2023-02-28: qty 3, 28d supply, fill #0

## 2023-03-02 ENCOUNTER — Other Ambulatory Visit (HOSPITAL_COMMUNITY): Payer: Self-pay

## 2023-03-07 ENCOUNTER — Ambulatory Visit (INDEPENDENT_AMBULATORY_CARE_PROVIDER_SITE_OTHER): Payer: 59

## 2023-03-07 DIAGNOSIS — J309 Allergic rhinitis, unspecified: Secondary | ICD-10-CM | POA: Diagnosis not present

## 2023-03-10 ENCOUNTER — Other Ambulatory Visit (HOSPITAL_COMMUNITY): Payer: Self-pay

## 2023-03-18 ENCOUNTER — Other Ambulatory Visit (HOSPITAL_COMMUNITY): Payer: Self-pay

## 2023-03-19 ENCOUNTER — Other Ambulatory Visit (HOSPITAL_COMMUNITY): Payer: Self-pay

## 2023-03-22 ENCOUNTER — Other Ambulatory Visit (HOSPITAL_COMMUNITY): Payer: Self-pay

## 2023-03-22 MED ORDER — WEGOVY 1 MG/0.5ML ~~LOC~~ SOAJ
1.0000 mg | SUBCUTANEOUS | 2 refills | Status: DC
  Filled 2023-03-22: qty 2, 28d supply, fill #0

## 2023-04-02 ENCOUNTER — Other Ambulatory Visit (HOSPITAL_COMMUNITY): Payer: Self-pay

## 2023-04-08 ENCOUNTER — Ambulatory Visit (INDEPENDENT_AMBULATORY_CARE_PROVIDER_SITE_OTHER): Payer: 59

## 2023-04-08 DIAGNOSIS — J309 Allergic rhinitis, unspecified: Secondary | ICD-10-CM

## 2023-04-19 ENCOUNTER — Ambulatory Visit (INDEPENDENT_AMBULATORY_CARE_PROVIDER_SITE_OTHER): Payer: 59 | Admitting: *Deleted

## 2023-04-19 DIAGNOSIS — J309 Allergic rhinitis, unspecified: Secondary | ICD-10-CM

## 2023-04-26 ENCOUNTER — Ambulatory Visit (INDEPENDENT_AMBULATORY_CARE_PROVIDER_SITE_OTHER): Payer: Self-pay | Admitting: *Deleted

## 2023-04-26 DIAGNOSIS — J309 Allergic rhinitis, unspecified: Secondary | ICD-10-CM

## 2023-05-04 ENCOUNTER — Ambulatory Visit (INDEPENDENT_AMBULATORY_CARE_PROVIDER_SITE_OTHER): Payer: Self-pay

## 2023-05-04 DIAGNOSIS — J309 Allergic rhinitis, unspecified: Secondary | ICD-10-CM | POA: Diagnosis not present

## 2023-05-18 ENCOUNTER — Ambulatory Visit (INDEPENDENT_AMBULATORY_CARE_PROVIDER_SITE_OTHER): Payer: Self-pay | Admitting: *Deleted

## 2023-05-18 DIAGNOSIS — J309 Allergic rhinitis, unspecified: Secondary | ICD-10-CM

## 2023-05-27 ENCOUNTER — Ambulatory Visit (INDEPENDENT_AMBULATORY_CARE_PROVIDER_SITE_OTHER): Payer: 59

## 2023-05-27 DIAGNOSIS — J309 Allergic rhinitis, unspecified: Secondary | ICD-10-CM

## 2023-06-07 ENCOUNTER — Ambulatory Visit (INDEPENDENT_AMBULATORY_CARE_PROVIDER_SITE_OTHER): Payer: Self-pay | Admitting: *Deleted

## 2023-06-07 DIAGNOSIS — J309 Allergic rhinitis, unspecified: Secondary | ICD-10-CM | POA: Diagnosis not present

## 2023-06-11 ENCOUNTER — Other Ambulatory Visit (HOSPITAL_COMMUNITY): Payer: Self-pay

## 2023-06-11 MED ORDER — WEGOVY 0.25 MG/0.5ML ~~LOC~~ SOAJ
0.2500 mg | SUBCUTANEOUS | 0 refills | Status: DC
  Filled 2023-06-11: qty 2, 28d supply, fill #0

## 2023-06-13 ENCOUNTER — Other Ambulatory Visit (HOSPITAL_COMMUNITY): Payer: Self-pay

## 2023-06-16 ENCOUNTER — Other Ambulatory Visit (HOSPITAL_COMMUNITY): Payer: Self-pay

## 2023-06-17 ENCOUNTER — Ambulatory Visit (INDEPENDENT_AMBULATORY_CARE_PROVIDER_SITE_OTHER): Payer: Self-pay

## 2023-06-17 DIAGNOSIS — J309 Allergic rhinitis, unspecified: Secondary | ICD-10-CM | POA: Diagnosis not present

## 2023-06-23 ENCOUNTER — Other Ambulatory Visit: Payer: Self-pay

## 2023-06-23 ENCOUNTER — Ambulatory Visit (INDEPENDENT_AMBULATORY_CARE_PROVIDER_SITE_OTHER): Payer: 59

## 2023-06-23 ENCOUNTER — Ambulatory Visit (INDEPENDENT_AMBULATORY_CARE_PROVIDER_SITE_OTHER): Payer: 59 | Admitting: Allergy & Immunology

## 2023-06-23 VITALS — BP 120/82 | HR 68 | Temp 98.3°F | Wt 267.6 lb

## 2023-06-23 DIAGNOSIS — K219 Gastro-esophageal reflux disease without esophagitis: Secondary | ICD-10-CM | POA: Diagnosis not present

## 2023-06-23 DIAGNOSIS — J309 Allergic rhinitis, unspecified: Secondary | ICD-10-CM

## 2023-06-23 DIAGNOSIS — J302 Other seasonal allergic rhinitis: Secondary | ICD-10-CM | POA: Diagnosis not present

## 2023-06-23 DIAGNOSIS — J3089 Other allergic rhinitis: Secondary | ICD-10-CM

## 2023-06-23 DIAGNOSIS — J4530 Mild persistent asthma, uncomplicated: Secondary | ICD-10-CM

## 2023-06-23 MED ORDER — SYMBICORT 160-4.5 MCG/ACT IN AERO: 2.0000 | INHALATION_SPRAY | Freq: Two times a day (BID) | RESPIRATORY_TRACT | 5 refills | Status: AC

## 2023-06-23 MED ORDER — ALBUTEROL SULFATE HFA 108 (90 BASE) MCG/ACT IN AERS: 2.0000 | INHALATION_SPRAY | RESPIRATORY_TRACT | 1 refills | Status: AC | PRN

## 2023-06-23 MED ORDER — CARBINOXAMINE MALEATE 4 MG PO TABS: 4.0000 mg | ORAL_TABLET | Freq: Four times a day (QID) | ORAL | 2 refills | Status: DC

## 2023-06-23 MED ORDER — TRIAMCINOLONE ACETONIDE 0.5 % EX OINT: 1.0000 | TOPICAL_OINTMENT | Freq: Two times a day (BID) | CUTANEOUS | 5 refills | Status: AC | PRN

## 2023-06-23 NOTE — Progress Notes (Signed)
FOLLOW UP  Date of Service/Encounter:  06/23/23   Assessment:   Mild persistent asthma, uncomplicated - likely allergy triggered   Perennial and seasonal allergic rhinitis - on allergen immunotherapy with improved tolerance with use of epinephrine rinses, but still has not reached maintenance   Adverse reaction to ant bites - not anaphylactic in nature  Plan/Recommendations:   1. Intermittent asthma, uncomplicated - Lung testing looks great today.  - Daily controller medication(s): NOTHING - Prior to physical activity: albuterol 2 puffs 10-15 minutes before physical activity. - Rescue medications: albuterol 4 puffs every 4-6 hours as needed - Changes during respiratory infections or worsening symptoms: Add on Symbicort 160/4.5 two puffs twice daily to 2 puffs twice daily for TWO WEEKS. - Asthma control goals:  * Full participation in all desired activities (may need albuterol before activity) * Albuterol use two time or less a week on average (not counting use with activity) * Cough interfering with sleep two time or less a month * Oral steroids no more than once a year * No hospitalizations  2. Seasonal and perennial allergic rhinitis - on allergen immunotherapy  - Consider switching antihistamines every 3-6 months.  - Continue with carbinoxamine every once daily (can increase to 3-4 times daily if needed).  - Continue with epinephrine rinses as you are doing. - Continue with antihistamine on the day of your injections. - Continue with triamcinolone 0.5% ointment applied to your injection sites IMMEDIATELY after the shots.  3. Return in about 6 months (around 12/21/2023).    Subjective:   Stacy Tran is a 52 y.o. female presenting today for follow up of  Chief Complaint  Patient presents with   Allergic Rhinitis    Asthma   Follow-up    She is doing well    Stacy Tran has a history of the following: Patient Active Problem List   Diagnosis Date Noted    Allergic conjunctivitis 06/25/2019   Acid reflux 02/19/2019   Mild persistent asthma 01/08/2019   Seasonal and perennial allergic rhinitis 01/08/2019   Cough, persistent 01/08/2019    History obtained from: chart review and patient.  Discussed the use of AI scribe software for clinical note transcription with the patient and/or guardian, who gave verbal consent to proceed.  Stacy Tran is a 52 y.o. female presenting for a follow up visit. She was last seen in May 2024. At that time, we continued with albuterol as needed with Symbicort added during flares. For her allergic rhinitis, we continued with alternating antihistamines as well as carbinoxamine. We did add epinephrine rinses to her allergy shots and we added on topical triamcinolone to her shot areas to help with inflammation.   Since the last visit, she has done well.   She did have a great time when she went to [place].  They were there for 6 days.  She decided she is not going to do any more national travel because her father is not a great help.  He is in his late 33s.  There are no major trips planned in the next 6 to 12 months.  Asthma/Respiratory Symptom History: She has not used her Symbicort at all since the last visit. She remains on the Symbicort on a PRN basis. She has not needed this in quite some time. She has not been on prednisone for her symptoms and her coughing and wheezing have been kept to a minimum. She has not been on prednisone for her symptoms at all.  She has not been using her albuterol and has not been to the hospital.   Allergic Rhinitis Symptom History: Her allergic rhinitis is under fair control with carbinoxamine.  She has to do this every day.  She does think that the allergy shots are helping.  She did miss a few weeks due to the folk festival.  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  She has not been using her nasal spray much at all.  She does feel like the allergy shots are helping  her.  Stacy Tran is on allergen immunotherapy. She receives two injections. Immunotherapy script #1 contains molds, cat, and dog. She currently receives 0.52mL of the GREEN vial (1/1,000). Immunotherapy script #2 contains trees, grasses, and dust mites. She currently receives 0.100mL of the GREEN vial (1/1,000). She started shots June of 2020 and not yet reached maintenance. The epi rinses are working well. She is still doing the topical steroid afterwards.   Otherwise, there have been no changes to her past medical history, surgical history, family history, or social history.    Review of systems otherwise negative other than that mentioned in the HPI.    Objective:   Blood pressure 120/82, pulse 68, temperature 98.3 F (36.8 C), temperature source Temporal, weight 267 lb 9.6 oz (121.4 kg), SpO2 96%. Body mass index is 48.94 kg/m.    Physical Exam Vitals reviewed.  Constitutional:      Appearance: She is well-developed.     Comments: Very lovely.  Talkative.   HENT:     Head: Normocephalic and atraumatic.     Right Ear: Tympanic membrane, ear canal and external ear normal.     Left Ear: Tympanic membrane, ear canal and external ear normal.     Nose: Rhinorrhea present. No nasal deformity, septal deviation or mucosal edema.     Right Turbinates: Enlarged, swollen and pale.     Left Turbinates: Enlarged, swollen and pale.     Right Sinus: No maxillary sinus tenderness or frontal sinus tenderness.     Left Sinus: No maxillary sinus tenderness or frontal sinus tenderness.     Comments: No nasal polyps noted.     Mouth/Throat:     Lips: Pink.     Mouth: Mucous membranes are moist. Mucous membranes are not pale and not dry.     Pharynx: Uvula midline.     Comments: Cobblestoning in the posterior oropharynx.  Eyes:     General: Lids are normal. Allergic shiner present.        Right eye: No discharge.        Left eye: No discharge.     Conjunctiva/sclera: Conjunctivae normal.      Right eye: Right conjunctiva is not injected. No chemosis.    Left eye: Left conjunctiva is not injected. No chemosis.    Pupils: Pupils are equal, round, and reactive to light.  Cardiovascular:     Rate and Rhythm: Normal rate and regular rhythm.     Heart sounds: Normal heart sounds.  Pulmonary:     Effort: Pulmonary effort is normal. No tachypnea, accessory muscle usage or respiratory distress.     Breath sounds: Normal breath sounds. No wheezing, rhonchi or rales.     Comments: Moving air well in all lung fields. No increased work of breathing noted. Chest:     Chest wall: No tenderness.  Lymphadenopathy:     Cervical: No cervical adenopathy.  Skin:    General: Skin is  warm.     Capillary Refill: Capillary refill takes less than 2 seconds.     Coloration: Skin is not pale.     Findings: No abrasion, erythema, petechiae or rash. Rash is not papular, urticarial or vesicular.     Comments: She does have swollen ankles bilaterally secondary to recent ant bites. No vesicles at this point.   Neurological:     Mental Status: She is alert.  Psychiatric:        Behavior: Behavior is cooperative.      Diagnostic studies:    Spirometry: results normal (FEV1: 2.41/101%, FVC: 3.10/105%, FEV1/FVC: 78%).    Spirometry consistent with normal pattern.    Allergy Studies: none        Malachi Bonds, MD  [facility]

## 2023-06-23 NOTE — Patient Instructions (Addendum)
1. Intermittent asthma, uncomplicated - Lung testing looks great today.  - Daily controller medication(s): NOTHING - Prior to physical activity: albuterol 2 puffs 10-15 minutes before physical activity. - Rescue medications: albuterol 4 puffs every 4-6 hours as needed - Changes during respiratory infections or worsening symptoms: Add on Symbicort 160/4.5 two puffs twice daily to 2 puffs twice daily for TWO WEEKS. - Asthma control goals:  * Full participation in all desired activities (may need albuterol before activity) * Albuterol use two time or less a week on average (not counting use with activity) * Cough interfering with sleep two time or less a month * Oral steroids no more than once a year * No hospitalizations  2. Seasonal and perennial allergic rhinitis - on allergen immunotherapy  - Consider switching antihistamines every 3-6 months.  - Continue with carbinoxamine every once daily (can increase to 3-4 times daily if needed).  - Continue with epinephrine rinses as you are doing. - Continue with antihistamine on the day of your injections. - Continue with triamcinolone 0.5% ointment applied to your injection sites IMMEDIATELY after the shots.  3. Return in about 6 months (around 12/21/2023).    Please inform us of any Emergency Department visits, hospitalizations, or changes in symptoms. Call us before going to the ED for breathing or allergy symptoms since we might be able to fit you in for a sick visit. Feel free to contact us anytime with any questions, problems, or concerns.  It was a pleasure to see you again today!  Websites that have reliable patient information: 1. American Academy of Asthma, Allergy, and Immunology: www.aaaai.org 2. Food Allergy Research and Education (FARE): foodallergy.org 3. Mothers of Asthmatics: http://www.asthmacommunitynetwork.org 4. American College of Allergy, Asthma, and Immunology: www.acaai.org   COVID-19 Vaccine Information can be found  at: [email or web address] For questions related to vaccine distribution or appointments, please email [email or web address] or call [phone].   We realize that you might be concerned about having an allergic reaction to the COVID19 vaccines. To help with that concern, WE ARE OFFERING THE COVID19 VACCINES IN OUR OFFICE! Ask the front desk for dates!     "Like" [place] on Facebook and Instagram for our latest updates!      A healthy democracy works best when [employer] participate! Make sure you are registered to vote! If you have moved or changed any of your contact information, you will need to get this updated before voting!  In some cases, you MAY be able to register to vote online: [email or web address]

## 2023-06-24 ENCOUNTER — Other Ambulatory Visit (HOSPITAL_COMMUNITY): Payer: Self-pay

## 2023-06-24 ENCOUNTER — Encounter: Payer: Self-pay | Admitting: Allergy & Immunology

## 2023-06-28 ENCOUNTER — Other Ambulatory Visit (HOSPITAL_COMMUNITY): Payer: Self-pay

## 2023-07-14 ENCOUNTER — Ambulatory Visit (INDEPENDENT_AMBULATORY_CARE_PROVIDER_SITE_OTHER): Payer: 59 | Admitting: *Deleted

## 2023-07-14 DIAGNOSIS — J309 Allergic rhinitis, unspecified: Secondary | ICD-10-CM | POA: Diagnosis not present

## 2023-07-22 ENCOUNTER — Ambulatory Visit (INDEPENDENT_AMBULATORY_CARE_PROVIDER_SITE_OTHER): Payer: Self-pay | Admitting: *Deleted

## 2023-07-22 DIAGNOSIS — J309 Allergic rhinitis, unspecified: Secondary | ICD-10-CM | POA: Diagnosis not present

## 2023-07-26 ENCOUNTER — Ambulatory Visit: Payer: Self-pay | Admitting: *Deleted

## 2023-08-11 ENCOUNTER — Ambulatory Visit (INDEPENDENT_AMBULATORY_CARE_PROVIDER_SITE_OTHER): Payer: 59 | Admitting: *Deleted

## 2023-08-11 DIAGNOSIS — J309 Allergic rhinitis, unspecified: Secondary | ICD-10-CM | POA: Diagnosis not present

## 2023-08-24 ENCOUNTER — Ambulatory Visit (INDEPENDENT_AMBULATORY_CARE_PROVIDER_SITE_OTHER): Payer: Self-pay | Admitting: *Deleted

## 2023-08-24 DIAGNOSIS — J309 Allergic rhinitis, unspecified: Secondary | ICD-10-CM | POA: Diagnosis not present

## 2023-09-08 ENCOUNTER — Ambulatory Visit (INDEPENDENT_AMBULATORY_CARE_PROVIDER_SITE_OTHER): Payer: 59 | Admitting: *Deleted

## 2023-09-08 DIAGNOSIS — J309 Allergic rhinitis, unspecified: Secondary | ICD-10-CM | POA: Diagnosis not present

## 2023-09-12 DIAGNOSIS — J3089 Other allergic rhinitis: Secondary | ICD-10-CM | POA: Diagnosis not present

## 2023-09-12 NOTE — Progress Notes (Signed)
MORE LABELS NEEDED.

## 2023-09-12 NOTE — Progress Notes (Signed)
VIALS EXP 09-11-24

## 2023-09-16 ENCOUNTER — Ambulatory Visit (INDEPENDENT_AMBULATORY_CARE_PROVIDER_SITE_OTHER): Payer: Self-pay

## 2023-09-16 DIAGNOSIS — J309 Allergic rhinitis, unspecified: Secondary | ICD-10-CM

## 2023-09-27 ENCOUNTER — Ambulatory Visit (INDEPENDENT_AMBULATORY_CARE_PROVIDER_SITE_OTHER): Payer: Self-pay | Admitting: *Deleted

## 2023-09-27 DIAGNOSIS — J309 Allergic rhinitis, unspecified: Secondary | ICD-10-CM

## 2023-10-19 ENCOUNTER — Ambulatory Visit (INDEPENDENT_AMBULATORY_CARE_PROVIDER_SITE_OTHER): Payer: Self-pay | Admitting: *Deleted

## 2023-10-19 DIAGNOSIS — J309 Allergic rhinitis, unspecified: Secondary | ICD-10-CM | POA: Diagnosis not present

## 2023-10-25 ENCOUNTER — Ambulatory Visit (INDEPENDENT_AMBULATORY_CARE_PROVIDER_SITE_OTHER): Payer: Self-pay | Admitting: *Deleted

## 2023-10-25 DIAGNOSIS — J309 Allergic rhinitis, unspecified: Secondary | ICD-10-CM | POA: Diagnosis not present

## 2023-11-03 ENCOUNTER — Ambulatory Visit (INDEPENDENT_AMBULATORY_CARE_PROVIDER_SITE_OTHER): Payer: Self-pay

## 2023-11-03 DIAGNOSIS — J309 Allergic rhinitis, unspecified: Secondary | ICD-10-CM | POA: Diagnosis not present

## 2023-11-11 ENCOUNTER — Ambulatory Visit (INDEPENDENT_AMBULATORY_CARE_PROVIDER_SITE_OTHER): Admitting: *Deleted

## 2023-11-11 DIAGNOSIS — J309 Allergic rhinitis, unspecified: Secondary | ICD-10-CM | POA: Diagnosis not present

## 2023-11-16 ENCOUNTER — Ambulatory Visit (INDEPENDENT_AMBULATORY_CARE_PROVIDER_SITE_OTHER): Payer: Self-pay

## 2023-11-16 DIAGNOSIS — J309 Allergic rhinitis, unspecified: Secondary | ICD-10-CM | POA: Diagnosis not present

## 2023-12-07 ENCOUNTER — Ambulatory Visit (INDEPENDENT_AMBULATORY_CARE_PROVIDER_SITE_OTHER): Admitting: Allergy & Immunology

## 2023-12-07 ENCOUNTER — Encounter: Payer: Self-pay | Admitting: Allergy & Immunology

## 2023-12-07 VITALS — BP 130/88 | HR 84 | Temp 97.8°F | Resp 18 | Wt 282.1 lb

## 2023-12-07 DIAGNOSIS — J3089 Other allergic rhinitis: Secondary | ICD-10-CM | POA: Diagnosis not present

## 2023-12-07 DIAGNOSIS — J302 Other seasonal allergic rhinitis: Secondary | ICD-10-CM | POA: Diagnosis not present

## 2023-12-07 DIAGNOSIS — J4530 Mild persistent asthma, uncomplicated: Secondary | ICD-10-CM

## 2023-12-07 MED ORDER — PREDNISONE 10 MG PO TABS: ORAL_TABLET | ORAL | 0 refills | Status: AC

## 2023-12-07 MED ORDER — TRELEGY ELLIPTA 100-62.5-25 MCG/ACT IN AEPB: 1.0000 | INHALATION_SPRAY | Freq: Every day | RESPIRATORY_TRACT | 5 refills | Status: AC

## 2023-12-07 NOTE — Patient Instructions (Addendum)
 1. Intermittent asthma, uncomplicated - Lung testing looks phenomenal today.  - Let's add on a short course of prednisone  to get things under better control. - We are going to get some labs to look for dififcult to control causes of asthma. - Stop your Symbicort  and change to Trelegy one puff once daily (sample provided).   - We may need to add Dupixent to your regimen.  - Daily controller medication(s): Trelegy 100mcg one puff once daily - Prior to physical activity: albuterol  2 puffs 10-15 minutes before physical activity. - Rescue medications: albuterol  4 puffs every 4-6 hours as needed - Asthma control goals:  * Full participation in all desired activities (may need albuterol  before activity) * Albuterol  use two time or less a week on average (not counting use with activity) * Cough interfering with sleep two time or less a month * Oral steroids no more than once a year * No hospitalizations  2. Seasonal and perennial allergic rhinitis - on allergen immunotherapy  - We are adding on the prednisone  course to get things under better control. - Restart the Singulair  (montelukast ) 10mg  (try taking half a tablet daily to see if this limits the side effects).  - Continue to switch antihistamines every 3-6 months.  - Continue with carbinoxamine  every once daily (can increase to 3-4 times daily if needed).  - Continue with epinephrine  rinses as you are doing. - Continue with antihistamine on the day of your injections. - Continue with triamcinolone  0.5% ointment applied to your injection sites IMMEDIATELY after the shots.  3. Return in about 3 months (around 03/07/2024).    Please inform us  of any Emergency Department visits, hospitalizations, or changes in symptoms. Call us  before going to the ED for breathing or allergy  symptoms since we might be able to fit you in for a sick visit. Feel free to contact us  anytime with any questions, problems, or concerns.  It was a pleasure to see you  again today!  Websites that have reliable patient information: 1. American Academy of Asthma, Allergy , and Immunology: www.aaaai.org 2. Food Allergy  Research and Education (FARE): foodallergy.org 3. Mothers of Asthmatics: http://www.asthmacommunitynetwork.org 4. [employer] of Allergy , Asthma, and Immunology: www.acaai.org   COVID-19 Vaccine Information can be found at: [email or web address] For questions related to vaccine distribution or appointments, please email [email or web address] or call [phone].   We realize that you might be concerned about having an allergic reaction to the COVID19 vaccines. To help with that concern, WE ARE OFFERING THE COVID19 VACCINES IN OUR OFFICE! Ask the front desk for dates!     "Like" us  on Facebook and Instagram for our latest updates!      A healthy democracy works best when [employer] participate! Make sure you are registered to vote! If you have moved or changed any of your contact information, you will need to get this updated before voting!  In some cases, you MAY be able to register to vote online: [email or web address]

## 2023-12-07 NOTE — Progress Notes (Signed)
 FOLLOW UP  Date of Service/Encounter:  12/07/23   Assessment:   Mild persistent asthma, uncomplicated - likely allergy  triggered   Perennial and seasonal allergic rhinitis - on allergen immunotherapy with improved tolerance with use of epinephrine  rinses, but still has not reached maintenance   Adverse reaction to ant bites - not anaphylactic in nature    Plan/Recommendations:   1. Intermittent asthma, uncomplicated - consider addition of Dupixent  - Lung testing looks phenomenal today.  - Let's add on a short course of prednisone  to get things under better control. - We are going to get some labs to look for dififcult to control causes of asthma. - Stop your Symbicort  and change to Trelegy one puff once daily (sample provided).   - We may need to add Dupixent to your regimen.  - Daily controller medication(s): Trelegy 100mcg one puff once daily - Prior to physical activity: albuterol  2 puffs 10-15 minutes before physical activity. - Rescue medications: albuterol  4 puffs every 4-6 hours as needed - Asthma control goals:  * Full participation in all desired activities (may need albuterol  before activity) * Albuterol  use two time or less a week on average (not counting use with activity) * Cough interfering with sleep two time or less a month * Oral steroids no more than once a year * No hospitalizations  2. Seasonal and perennial allergic rhinitis - on allergen immunotherapy  - We are adding on the prednisone  course to get things under better control. - Restart the Singulair  (montelukast ) 10mg  (try taking half a tablet daily to see if this limits the side effects).  - Continue to switch antihistamines every 3-6 months.  - Continue with carbinoxamine  every once daily (can increase to 3-4 times daily if needed).  - Continue with epinephrine  rinses as you are doing. - Continue with antihistamine on the day of your injections. - Continue with triamcinolone  0.5% ointment applied  to your injection sites IMMEDIATELY after the shots.  3. Return in about 3 months (around 03/07/2024).    Subjective:   Stacy Tran is a 53 y.o. female presenting today for follow up of  Chief Complaint  Patient presents with   Allergic Rhinitis     Allergies are flaring her asthma up.    Asthma    Stacy Tran has a history of the following: Patient Active Problem List   Diagnosis Date Noted   Allergic conjunctivitis 06/25/2019   Acid reflux 02/19/2019   Mild persistent asthma 01/08/2019   Seasonal and perennial allergic rhinitis 01/08/2019   Cough, persistent 01/08/2019    History obtained from: chart review and patient.  Discussed the use of AI scribe software for clinical note transcription with the patient and/or guardian, who gave verbal consent to proceed.  Stacy Tran is a 53 y.o. female presenting for a follow up visit.  She was last seen in November 2024.  At that time, her lung testing looked great.  We continued with albuterol  as needed and Symbicort  added for 1 to 2 weeks during flares.  For her rhinitis, continue with allergen immunotherapy.  We recommended switching antihistamines every 3 to 6 months.  She also remained on carbinoxamine  4 mg once a day.  She also adds triamcinolone  immediately after her allergy  shots.  Since the last visit, she has had some problems with coughing and chest tightness.   Asthma/Respiratory Symptom History: Over the past three weeks, she has experienced worsening respiratory symptoms, including shortness of breath, wheezing, and coughing.  Despite daily use of her Symbicort  inhaler and as-needed use of albuterol , her symptoms have not improved. She notes increased weakness, with symptoms worsening at night and improving in environments with fewer allergens, such as the mountains. She has been using her Symbicort  more regularly, but she has never been one to want to use something consistently.   Allergic Rhinitis Symptom History: She  has a history of asthma and environmental allergies, which she believes contribute to her respiratory issues. She previously discontinued allergy  shots due to swelling and has been using Allegra, which is no longer effective. She has not used prednisone  recently and reports no significant relief from her current medication regimen. She has been using carbinoxamine , typically 8 mg at a time, and has a history of using Singulair , which she discontinued due to side effects like dry mouth.   Stacy Tran is on allergen immunotherapy. She receives two injections. Immunotherapy script #1 contains molds, cat, and dog. She currently receives 0.2mL of the GREEN vial (1/1,000). Immunotherapy script #2 contains trees, grasses, and dust mites. She currently receives 0.2mL of the GREEN vial (1/1,000). She started shots June of 2020 and not yet reached maintenance. The epi rinses are working well. She is still doing the topical steroid afterwards.   GERD Symptom History: She denies heartburn as a significant issue, though she experienced mild heartburn a week ago for a couple of days. She also mentions having issues with her teeth, possibly related to frequent use of inhalers and cough drops.  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 She has a summer solstice event coming up in June.   Otherwise, there have been no changes to her past medical history, surgical history, family history, or social history.    Review of systems otherwise negative other than that mentioned in the HPI.    Objective:   Blood pressure 130/88, pulse 84, temperature 97.8 F (36.6 C), resp. rate 18, weight 282 lb 2 oz (128 kg), SpO2 95%. Body mass index is 51.6 kg/m.    Physical Exam Vitals reviewed.  Constitutional:      Appearance: She is well-developed.     Comments: Very lovely.   Talkative.   HENT:     Head: Normocephalic and atraumatic.     Right Ear: Tympanic membrane, ear canal and external ear normal.     Left Ear: Tympanic membrane, ear canal and external ear normal.     Nose: No nasal deformity, septal deviation, mucosal edema or rhinorrhea.     Right Turbinates: Enlarged, swollen and pale.     Left Turbinates: Enlarged, swollen and pale.     Right Sinus: No maxillary sinus tenderness or frontal sinus tenderness.     Left Sinus: No maxillary sinus tenderness or frontal sinus tenderness.     Comments: No nasal polyps noted.     Mouth/Throat:     Lips: Pink.     Mouth: Mucous membranes are moist. Mucous membranes are not pale and not dry.     Pharynx: Uvula midline.     Comments: Cobblestoning in the posterior oropharynx.  Eyes:     General: Lids are normal. Allergic shiner present.        Right eye: No discharge.        Left eye: No discharge.     Conjunctiva/sclera: Conjunctivae normal.     Right eye:  Right conjunctiva is not injected. No chemosis.    Left eye: Left conjunctiva is not injected. No chemosis.    Pupils: Pupils are equal, round, and reactive to light.  Cardiovascular:     Rate and Rhythm: Normal rate and regular rhythm.     Heart sounds: Normal heart sounds.  Pulmonary:     Effort: Pulmonary effort is normal. No tachypnea, accessory muscle usage or respiratory distress.     Breath sounds: Normal breath sounds. No wheezing, rhonchi or rales.     Comments: Moving air well in all lung fields. No increased work of breathing noted. Coughing when she takes a deeper breath.  Chest:     Chest wall: No tenderness.  Lymphadenopathy:     Cervical: No cervical adenopathy.  Skin:    General: Skin is warm.     Capillary Refill: Capillary refill takes less than 2 seconds.     Coloration: Skin is not pale.     Findings: No abrasion, erythema, petechiae or rash. Rash is not papular, urticarial or vesicular.     Comments: She does have swollen ankles  bilaterally secondary to recent ant bites. No vesicles at this point.   Neurological:     Mental Status: She is alert.  Psychiatric:        Behavior: Behavior is cooperative.      Diagnostic studies:    Spirometry: results normal (FEV1: 2.35/100%, FVC: 2.80/97%, FEV1/FVC: 84%).    Spirometry consistent with normal pattern.    Allergy  Studies: none       Drexel Gentles, MD  [facility] 

## 2023-12-15 ENCOUNTER — Encounter: Payer: Self-pay | Admitting: Allergy & Immunology

## 2023-12-15 ENCOUNTER — Ambulatory Visit (INDEPENDENT_AMBULATORY_CARE_PROVIDER_SITE_OTHER): Payer: Self-pay

## 2023-12-15 DIAGNOSIS — J309 Allergic rhinitis, unspecified: Secondary | ICD-10-CM

## 2023-12-20 ENCOUNTER — Ambulatory Visit (INDEPENDENT_AMBULATORY_CARE_PROVIDER_SITE_OTHER): Payer: Self-pay

## 2023-12-20 DIAGNOSIS — J309 Allergic rhinitis, unspecified: Secondary | ICD-10-CM

## 2023-12-22 ENCOUNTER — Ambulatory Visit: Payer: 59 | Admitting: Allergy & Immunology

## 2023-12-29 ENCOUNTER — Ambulatory Visit (INDEPENDENT_AMBULATORY_CARE_PROVIDER_SITE_OTHER)

## 2023-12-29 DIAGNOSIS — J309 Allergic rhinitis, unspecified: Secondary | ICD-10-CM | POA: Diagnosis not present

## 2024-01-04 ENCOUNTER — Telehealth: Payer: Self-pay | Admitting: *Deleted

## 2024-01-04 NOTE — Telephone Encounter (Signed)
-----   Message from [place] Chuck sent at 12/07/2023  1:00 PM EDT ----- Consider Dupixent add on.

## 2024-01-04 NOTE — Telephone Encounter (Signed)
 Called patient and advised she does not want to try Dupixent at this time. Doesn't really like the Trelegey either.

## 2024-01-05 ENCOUNTER — Ambulatory Visit (INDEPENDENT_AMBULATORY_CARE_PROVIDER_SITE_OTHER)

## 2024-01-05 DIAGNOSIS — J309 Allergic rhinitis, unspecified: Secondary | ICD-10-CM | POA: Diagnosis not present

## 2024-01-09 NOTE — Telephone Encounter (Signed)
OK sounds good!  ? ?Salvatore Marvel, MD ?[facility] ? ?

## 2024-01-20 ENCOUNTER — Ambulatory Visit (INDEPENDENT_AMBULATORY_CARE_PROVIDER_SITE_OTHER): Payer: Self-pay

## 2024-01-20 DIAGNOSIS — J309 Allergic rhinitis, unspecified: Secondary | ICD-10-CM | POA: Diagnosis not present

## 2024-01-24 ENCOUNTER — Ambulatory Visit (INDEPENDENT_AMBULATORY_CARE_PROVIDER_SITE_OTHER)

## 2024-01-24 DIAGNOSIS — J309 Allergic rhinitis, unspecified: Secondary | ICD-10-CM

## 2024-02-08 ENCOUNTER — Ambulatory Visit (INDEPENDENT_AMBULATORY_CARE_PROVIDER_SITE_OTHER)

## 2024-02-08 DIAGNOSIS — J309 Allergic rhinitis, unspecified: Secondary | ICD-10-CM | POA: Diagnosis not present

## 2024-02-13 ENCOUNTER — Ambulatory Visit (INDEPENDENT_AMBULATORY_CARE_PROVIDER_SITE_OTHER)

## 2024-02-13 DIAGNOSIS — J309 Allergic rhinitis, unspecified: Secondary | ICD-10-CM

## 2024-02-24 ENCOUNTER — Ambulatory Visit

## 2024-02-24 DIAGNOSIS — J309 Allergic rhinitis, unspecified: Secondary | ICD-10-CM | POA: Diagnosis not present

## 2024-02-28 ENCOUNTER — Ambulatory Visit (INDEPENDENT_AMBULATORY_CARE_PROVIDER_SITE_OTHER)

## 2024-02-28 DIAGNOSIS — J309 Allergic rhinitis, unspecified: Secondary | ICD-10-CM | POA: Diagnosis not present

## 2024-03-14 ENCOUNTER — Ambulatory Visit (INDEPENDENT_AMBULATORY_CARE_PROVIDER_SITE_OTHER)

## 2024-03-14 DIAGNOSIS — J309 Allergic rhinitis, unspecified: Secondary | ICD-10-CM

## 2024-03-20 ENCOUNTER — Ambulatory Visit (INDEPENDENT_AMBULATORY_CARE_PROVIDER_SITE_OTHER)

## 2024-03-20 DIAGNOSIS — J309 Allergic rhinitis, unspecified: Secondary | ICD-10-CM | POA: Diagnosis not present

## 2024-03-27 ENCOUNTER — Ambulatory Visit (INDEPENDENT_AMBULATORY_CARE_PROVIDER_SITE_OTHER)

## 2024-03-27 DIAGNOSIS — J309 Allergic rhinitis, unspecified: Secondary | ICD-10-CM | POA: Diagnosis not present

## 2024-04-10 ENCOUNTER — Ambulatory Visit (INDEPENDENT_AMBULATORY_CARE_PROVIDER_SITE_OTHER)

## 2024-04-10 DIAGNOSIS — J309 Allergic rhinitis, unspecified: Secondary | ICD-10-CM

## 2024-04-25 ENCOUNTER — Ambulatory Visit (INDEPENDENT_AMBULATORY_CARE_PROVIDER_SITE_OTHER)

## 2024-04-25 DIAGNOSIS — J309 Allergic rhinitis, unspecified: Secondary | ICD-10-CM

## 2024-05-10 ENCOUNTER — Ambulatory Visit

## 2024-05-10 DIAGNOSIS — J309 Allergic rhinitis, unspecified: Secondary | ICD-10-CM | POA: Diagnosis not present

## 2024-05-24 ENCOUNTER — Ambulatory Visit (INDEPENDENT_AMBULATORY_CARE_PROVIDER_SITE_OTHER)

## 2024-05-24 DIAGNOSIS — J309 Allergic rhinitis, unspecified: Secondary | ICD-10-CM

## 2024-05-28 ENCOUNTER — Ambulatory Visit (INDEPENDENT_AMBULATORY_CARE_PROVIDER_SITE_OTHER)

## 2024-05-28 DIAGNOSIS — J309 Allergic rhinitis, unspecified: Secondary | ICD-10-CM | POA: Diagnosis not present

## 2024-06-05 ENCOUNTER — Other Ambulatory Visit: Payer: Self-pay | Admitting: Allergy & Immunology

## 2024-06-07 ENCOUNTER — Other Ambulatory Visit: Payer: Self-pay | Admitting: Urology

## 2024-06-08 ENCOUNTER — Ambulatory Visit (INDEPENDENT_AMBULATORY_CARE_PROVIDER_SITE_OTHER)

## 2024-06-08 DIAGNOSIS — J309 Allergic rhinitis, unspecified: Secondary | ICD-10-CM

## 2024-06-11 ENCOUNTER — Ambulatory Visit (INDEPENDENT_AMBULATORY_CARE_PROVIDER_SITE_OTHER)

## 2024-06-11 DIAGNOSIS — J309 Allergic rhinitis, unspecified: Secondary | ICD-10-CM

## 2024-06-18 ENCOUNTER — Encounter (HOSPITAL_COMMUNITY): Payer: Self-pay

## 2024-06-18 NOTE — Patient Instructions (Addendum)
 SURGICAL WAITING ROOM VISITATION  Patients having surgery or a procedure may have no more than 2 support people in the waiting area - these visitors may rotate.    Children under the age of 52 must have an adult with them who is not the patient.  Visitors with respiratory illnesses are discouraged from visiting and should remain at home.  If the patient needs to stay at the hospital during part of their recovery, the visitor guidelines for inpatient rooms apply. Pre-op nurse will coordinate an appropriate time for 1 support person to accompany patient in pre-op.  This support person may not rotate.    Please refer to the [facility] website for the visitor guidelines for Inpatients (after your surgery is over and you are in a regular room).       Your procedure is scheduled on: 06-21-24    Report to [facility] Main Entrance    Report to admitting at       0920   AM   Call this number if you have problems the morning of surgery [phone]   Do not eat food  or drink liquids  :After Midnight.                   If you have questions, please contact your surgeon's office.   FOLLOW ANY ADDITIONAL PRE OP INSTRUCTIONS YOU RECEIVED FROM YOUR SURGEON'S OFFICE!!!     Oral Hygiene is also important to reduce your risk of infection.                                    Remember - BRUSH YOUR TEETH THE MORNING OF SURGERY WITH YOUR REGULAR TOOTHPASTE  DENTURES WILL BE REMOVED PRIOR TO SURGERY PLEASE DO NOT APPLY Poly grip OR ADHESIVES!!!   Do NOT smoke after Midnight   Stop all vitamins and herbal supplements 7 days before surgery.   Take these medicines the morning of surgery with A SIP OF WATER: inhalers and bring rescue inhaler with you, fexofenadine, eye drops as usual                                 You may not have any metal on your body including hair pins, jewelry, and body piercing             Do not wear make-up, lotions, powders, perfumes/cologne, or  deodorant  Do not wear nail polish including gel and S&S, artificial/acrylic nails, or any other type of covering on natural nails including finger and toenails. If you have artificial nails, gel coating, etc. that needs to be removed by a nail salon please have this removed prior to surgery or surgery may need to be canceled/ delayed if the surgeon/ anesthesia feels like they are unable to be safely monitored.   Do not shave  48 hours prior to surgery.      Do not bring valuables to the hospital. [place] IS NOT             RESPONSIBLE   FOR VALUABLES.   Contacts, glasses, dentures or bridgework may not be worn into surgery.   Bring small overnight bag day of surgery.   DO NOT BRING YOUR HOME MEDICATIONS TO THE HOSPITAL. PHARMACY WILL DISPENSE MEDICATIONS LISTED ON YOUR MEDICATION LIST TO YOU DURING YOUR ADMISSION IN THE HOSPITAL!  Patients discharged on the day of surgery will not be allowed to drive home.  Someone NEEDS to stay with you for the first 24 hours after anesthesia.   Special Instructions: Bring a copy of your healthcare power of attorney and living will documents the day of surgery if you haven't scanned them before.              Please read over the following fact sheets you were given: IF YOU HAVE QUESTIONS ABOUT YOUR PRE-OP INSTRUCTIONS PLEASE CALL [phone].    If you test positive for Covid or have been in contact with anyone that has tested positive in the last 10 days please notify you surgeon.    [facility] - Preparing for Surgery Before surgery, you can play an important role.  Because skin is not sterile, your skin needs to be as free of germs as possible.  You can reduce the number of germs on your skin by washing with CHG (chlorahexidine gluconate) soap before surgery.  CHG is an antiseptic cleaner which kills germs and bonds with the skin to continue killing germs even after washing. Please DO NOT use if you have an allergy  to CHG or antibacterial soaps.   If your skin becomes reddened/irritated stop using the CHG and inform your nurse when you arrive at Short Stay. Do not shave (including legs and underarms) for at least 48 hours prior to the first CHG shower.  You may shave your face/neck.  Please follow these instructions carefully:  1.  Shower with CHG Soap the night before surgery and the morning of surgery.  2.  If you choose to wash your hair, wash your hair first as usual with your normal  shampoo.  3.  After you shampoo, rinse your hair and body thoroughly to remove the shampoo.                             4.  Use CHG as you would any other liquid soap.  You can apply chg directly to the skin and wash.  Gently with a scrungie or clean washcloth.  5.  Apply the CHG Soap to your body ONLY FROM THE NECK DOWN.   Do   not use on face/ open                           Wound or open sores. Avoid contact with eyes, ears mouth and   genitals (private parts).                       Wash face,  Genitals (private parts) with your normal soap.             6.  Wash thoroughly, paying special attention to the area where your    surgery  will be performed.  7.  Thoroughly rinse your body with warm water from the neck down.  8.  DO NOT shower/wash with your normal soap after using and rinsing off the CHG Soap.                9.  Pat yourself dry with a clean towel.            10.  Wear clean pajamas.            11.  Place clean sheets on your bed the night of your first shower and do not  sleep with pets. Day of Surgery : Do not apply any CHG, lotions/deodorants the morning of surgery.  Please wear clean clothes to the hospital/surgery center.  FAILURE TO FOLLOW THESE INSTRUCTIONS MAY RESULT IN THE CANCELLATION OF YOUR SURGERY  PATIENT SIGNATURE_________________________________  NURSE SIGNATURE__________________________________  ________________________________________________________________________

## 2024-06-18 NOTE — Progress Notes (Signed)
 PCP -  Cardiologist -   PPM/ICD -  Device Orders -  Rep Notified -   Chest x-ray -  EKG -  Stress Test -  ECHO - 2014 epic Cardiac Cath -   Sleep Study -  CPAP -   Fasting Blood Sugar -  Checks Blood Sugar _____ times a day  Blood Thinner Instructions: Aspirin Instructions:  ERAS Protcol - PRE-SURGERY Ensure or G2-    COVID vaccine -  Activity-- Anesthesia review:   Patient denies shortness of breath, fever, cough and chest pain at PAT appointment   All instructions explained to the patient, with a verbal understanding of the material. Patient agrees to go over the instructions while at home for a better understanding. Patient also instructed to self quarantine after being tested for COVID-19. The opportunity to ask questions was provided.

## 2024-06-20 ENCOUNTER — Encounter (HOSPITAL_COMMUNITY)
Admission: RE | Admit: 2024-06-20 | Discharge: 2024-06-20 | Disposition: A | Discharge status: Home / Self Care | Payer: Self-pay | Source: Ambulatory Visit | Attending: Anesthesiology | Admitting: Anesthesiology

## 2024-06-20 DIAGNOSIS — Z01818 Encounter for other preprocedural examination: Secondary | ICD-10-CM

## 2024-06-20 HISTORY — DX: Essential (primary) hypertension: I10

## 2024-06-21 ENCOUNTER — Ambulatory Visit: Payer: 59 | Admitting: Allergy & Immunology

## 2024-06-21 ENCOUNTER — Ambulatory Visit (HOSPITAL_COMMUNITY): Admission: RE | Admit: 2024-06-21 | Payer: Self-pay | Source: Home / Self Care | Admitting: Urology

## 2024-06-21 ENCOUNTER — Encounter (HOSPITAL_COMMUNITY): Admission: RE | Payer: Self-pay | Source: Home / Self Care

## 2024-06-21 SURGERY — CYSTOSCOPY, WITH INJECTION OF BLADDER NECK OR BLADDER WALL
Anesthesia: IV Sedation (MBSC Only)

## 2024-06-28 ENCOUNTER — Encounter: Payer: Self-pay | Admitting: Allergy & Immunology

## 2024-06-28 ENCOUNTER — Ambulatory Visit (INDEPENDENT_AMBULATORY_CARE_PROVIDER_SITE_OTHER): Admitting: Allergy & Immunology

## 2024-06-28 VITALS — BP 126/86 | HR 80 | Temp 98.4°F | Ht 62.75 in | Wt 275.5 lb

## 2024-06-28 DIAGNOSIS — J302 Other seasonal allergic rhinitis: Secondary | ICD-10-CM | POA: Diagnosis not present

## 2024-06-28 DIAGNOSIS — J4530 Mild persistent asthma, uncomplicated: Secondary | ICD-10-CM

## 2024-06-28 DIAGNOSIS — J3089 Other allergic rhinitis: Secondary | ICD-10-CM | POA: Diagnosis not present

## 2024-06-28 DIAGNOSIS — J309 Allergic rhinitis, unspecified: Secondary | ICD-10-CM

## 2024-06-28 NOTE — Progress Notes (Signed)
 FOLLOW UP  Date of Service/Encounter:  06/28/24   Assessment:   Mild persistent asthma, uncomplicated - likely allergy  triggered   Perennial and seasonal allergic rhinitis - on allergen immunotherapy with improved tolerance with use of epinephrine  rinses, but still has not reached maintenance   Adverse reaction to ant bites - not anaphylactic in nature  Plan/Recommendations:   1. Intermittent asthma, uncomplicated - Lung testing looks phenomenal today.  - It seems that your breathing is under better control.  - It seems like we have you in a good spot.  - Daily controller medication(s): NOTHING - Prior to physical activity: albuterol  2 puffs 10-15 minutes before physical activity. - Rescue medications: albuterol  4 puffs every 4-6 hours as needed - Asthma control goals:  * Full participation in all desired activities (may need albuterol  before activity) * Albuterol  use two time or less a week on average (not counting use with activity) * Cough interfering with sleep two time or less a month * Oral steroids no more than once a year * No hospitalizations  2. Seasonal and perennial allergic rhinitis - on allergen immunotherapy  - I am glad you reached your RED VIAL.  - You reached that in August 2025, so 5 years would be August 2030. - Continue with Singulair  (montelukast ) 10mg  (try taking half a tablet daily to see if this limits the side effects).  - Continue to switch antihistamines every 3-6 months.  - Continue with carbinoxamine  every once daily (can increase to 3-4 times daily if needed).  - Continue with epinephrine  rinses as you are doing. - Continue with antihistamine on the day of your injections. - Continue with triamcinolone  0.5% ointment applied to your injection sites IMMEDIATELY after the shots.  3. Return in about 6 months (around 12/26/2024). You can have the follow up appointment with Dr. Iva or a Nurse Practicioner (our Nurse Practitioners are excellent  and always have Physician oversight!).   Subjective:   Stacy Tran is a 53 y.o. female presenting today for follow up of  Chief Complaint  Patient presents with   Follow-up    Pt states over all allergies and asthma symptoms have been managed. She denies recent URI's and asthma flare ups.    Stacy Tran has a history of the following: Patient Active Problem List   Diagnosis Date Noted   Allergic conjunctivitis 06/25/2019   Acid reflux 02/19/2019   Mild persistent asthma 01/08/2019   Seasonal and perennial allergic rhinitis 01/08/2019   Cough, persistent 01/08/2019    History obtained from: chart review and patient.  Discussed the use of AI scribe software for clinical note transcription with the patient and/or guardian, who gave verbal consent to proceed.  Stacy Tran is a 53 y.o. female presenting for a follow up visit.  She was last seen in April 2025.  At that time, we added on prednisone  for short period of time.  We restarted her Singulair  and recommended switching antihistamines every 3 to 6 months.  We continue with carbinoxamine  as well as epinephrine  rinses and triamcinolone .    Since last visit, he has done well.  Asthma/Respiratory Symptom History: Her breathing has been well-controlled, and she has not needed to use her rescue inhaler since her last treatment. She previously tried Trelegy but did not like it and has a prescription that she does not use. Overall the use of her inhaled has improved as her allergy  shots have increased in strength.   Allergic Rhinitis Symptom History:  She has been receiving allergy  shots and notes that the last one caused significant swelling and redness, which spread around her arm. She is currently at the top dose of her allergy  shots. She pushes through since the shots are monthly at this point and they have helped her allergy  and asthma symptoms so much.   Stacy Tran is on allergen immunotherapy. She receives two injections.  Immunotherapy script #1 contains molds, cat, and dog. She currently receives 0.66mL of the RED vial (1/100). Immunotherapy script #2 contains trees, grasses, and dust mites. She currently receives 0.57mL of the RED vial (1/100). She started shots June of 2020 and reached maintenance in August 2025. The epi rinses are working well. She is still doing the topical steroid afterwards.   Skin Symptom History: She experienced hives in the past, which she attributes to a reaction to antibiotics. She was on multiple antibiotics for UTIs, and the reaction was suppressed by a steroid she was taking for her lungs. The hives appeared on her arms and buttocks, and she is unsure which antibiotic caused the reaction.   Infection Symptom History: She has been taking turmeric and probiotics to support her immune system and reports that these have been effective. She is also using Singulair  as needed but has not required it recently.  Otherwise, there have been no changes to her past medical history, surgical history, family history, or social history.    Review of systems otherwise negative other than that mentioned in the HPI.    Objective:   Blood pressure 126/86, pulse 80, temperature 98.4 F (36.9 C), temperature source Temporal, height 5' 2.75 (1.594 m), weight 275 lb 8 oz (125 kg), SpO2 96%. Body mass index is 49.19 kg/m.    Physical Exam Vitals reviewed.  Constitutional:      Appearance: She is well-developed.     Comments: Very lovely.  Talkative.   HENT:     Head: Normocephalic and atraumatic.     Right Ear: Tympanic membrane, ear canal and external ear normal.     Left Ear: Tympanic membrane, ear canal and external ear normal.     Nose: No nasal deformity, septal deviation, mucosal edema or rhinorrhea.     Right Turbinates: Enlarged, swollen and pale.     Left Turbinates: Enlarged, swollen and pale.     Right Sinus: No maxillary sinus tenderness or frontal sinus tenderness.     Left  Sinus: No maxillary sinus tenderness or frontal sinus tenderness.     Comments: No nasal polyps noted.     Mouth/Throat:     Lips: Pink.     Mouth: Mucous membranes are moist. Mucous membranes are not pale and not dry.     Pharynx: Uvula midline.     Comments: Cobblestoning in the posterior oropharynx.  Eyes:     General: Lids are normal. Allergic shiner present.        Right eye: No discharge.        Left eye: No discharge.     Conjunctiva/sclera: Conjunctivae normal.     Right eye: Right conjunctiva is not injected. No chemosis.    Left eye: Left conjunctiva is not injected. No chemosis.    Pupils: Pupils are equal, round, and reactive to light.  Cardiovascular:     Rate and Rhythm: Normal rate and regular rhythm.     Heart sounds: Normal heart sounds.  Pulmonary:     Effort: Pulmonary effort is normal. No tachypnea, accessory muscle usage or respiratory distress.  Breath sounds: Normal breath sounds. No wheezing, rhonchi or rales.     Comments: Moving air well in all lung fields. No increased work of breathing noted. Coughing when she takes a deeper breath.  Chest:     Chest wall: No tenderness.  Lymphadenopathy:     Cervical: No cervical adenopathy.  Skin:    General: Skin is warm.     Capillary Refill: Capillary refill takes less than 2 seconds.     Coloration: Skin is not pale.     Findings: No abrasion, erythema, petechiae or rash. Rash is not papular, urticarial or vesicular.     Comments: She does have swollen ankles bilaterally secondary to recent ant bites. No vesicles at this point.   Neurological:     Mental Status: She is alert.  Psychiatric:        Behavior: Behavior is cooperative.      Diagnostic studies:    Spirometry: results normal (FEV1: 2.64/113%, FVC: 3.15/109%, FEV1/FVC: 84%).    Spirometry consistent with normal pattern.   Allergy  Studies: none       Marty Shaggy, MD  [facility] 

## 2024-06-28 NOTE — Patient Instructions (Addendum)
 1. Intermittent asthma, uncomplicated - Lung testing looks phenomenal today.  - It seems that your breathing is under better control.  - It seems like we have you in a good spot.  - Daily controller medication(s): NOTHING - Prior to physical activity: albuterol  2 puffs 10-15 minutes before physical activity. - Rescue medications: albuterol  4 puffs every 4-6 hours as needed - Asthma control goals:  * Full participation in all desired activities (may need albuterol  before activity) * Albuterol  use two time or less a week on average (not counting use with activity) * Cough interfering with sleep two time or less a month * Oral steroids no more than once a year * No hospitalizations  2. Seasonal and perennial allergic rhinitis - on allergen immunotherapy  - I am glad you reached your RED VIAL.  - You reached that in August 2025, so 5 years would be August 2030. - Continue with Singulair  (montelukast ) 10mg  (try taking half a tablet daily to see if this limits the side effects).  - Continue to switch antihistamines every 3-6 months.  - Continue with carbinoxamine  every once daily (can increase to 3-4 times daily if needed).  - Continue with epinephrine  rinses as you are doing. - Continue with antihistamine on the day of your injections. - Continue with triamcinolone  0.5% ointment applied to your injection sites IMMEDIATELY after the shots.  3. Return in about 6 months (around 12/26/2024). You can have the follow up appointment with Dr. Iva or a Nurse Practicioner (our Nurse Practitioners are excellent and always have Physician oversight!).    Please inform us  of any Emergency Department visits, hospitalizations, or changes in symptoms. Call us  before going to the ED for breathing or allergy  symptoms since we might be able to fit you in for a sick visit. Feel free to contact us  anytime with any questions, problems, or concerns.  It was a pleasure to see you again today!  Websites that  have reliable patient information: 1. American Academy of Asthma, Allergy , and Immunology: www.aaaai.org 2. Food Allergy  Research and Education (FARE): foodallergy.org 3. Mothers of Asthmatics: http://www.asthmacommunitynetwork.org 4. American College of Allergy , Asthma, and Immunology: www.acaai.org      "Like" us  on Facebook and Instagram for our latest updates!      A healthy democracy works best when [employer] participate! Make sure you are registered to vote! If you have moved or changed any of your contact information, you will need to get this updated before voting! Scan the QR codes below to learn more!

## 2024-07-02 ENCOUNTER — Encounter: Payer: Self-pay | Admitting: Allergy & Immunology

## 2024-07-04 ENCOUNTER — Ambulatory Visit (INDEPENDENT_AMBULATORY_CARE_PROVIDER_SITE_OTHER)

## 2024-07-04 DIAGNOSIS — J309 Allergic rhinitis, unspecified: Secondary | ICD-10-CM

## 2024-07-09 ENCOUNTER — Ambulatory Visit

## 2024-07-09 DIAGNOSIS — J309 Allergic rhinitis, unspecified: Secondary | ICD-10-CM

## 2024-08-10 ENCOUNTER — Ambulatory Visit

## 2024-08-10 DIAGNOSIS — J309 Allergic rhinitis, unspecified: Secondary | ICD-10-CM | POA: Diagnosis not present

## 2024-08-20 ENCOUNTER — Ambulatory Visit

## 2024-08-20 DIAGNOSIS — J302 Other seasonal allergic rhinitis: Secondary | ICD-10-CM | POA: Diagnosis not present

## 2024-08-28 ENCOUNTER — Ambulatory Visit

## 2024-08-28 DIAGNOSIS — J302 Other seasonal allergic rhinitis: Secondary | ICD-10-CM

## 2024-08-28 DIAGNOSIS — J3089 Other allergic rhinitis: Secondary | ICD-10-CM

## 2024-09-06 ENCOUNTER — Ambulatory Visit

## 2024-09-06 DIAGNOSIS — J302 Other seasonal allergic rhinitis: Secondary | ICD-10-CM | POA: Diagnosis not present

## 2024-09-12 ENCOUNTER — Ambulatory Visit

## 2024-09-12 DIAGNOSIS — J302 Other seasonal allergic rhinitis: Secondary | ICD-10-CM | POA: Diagnosis not present

## 2024-12-25 ENCOUNTER — Ambulatory Visit: Admitting: Allergy & Immunology
